# Patient Record
Sex: Male | Born: 1970 | Race: Black or African American | Hispanic: No
Health system: Southern US, Community
[De-identification: ages and names within clinical notes are randomized; demographics above are authoritative.]

## PROBLEM LIST (undated history)

## (undated) DIAGNOSIS — E119 Type 2 diabetes mellitus without complications: Secondary | ICD-10-CM

## (undated) HISTORY — DX: Type 2 diabetes mellitus without complications: E11.9

## (undated) HISTORY — PX: MOUTH SURGERY: SHX715

---

## 2001-05-18 ENCOUNTER — Emergency Department (HOSPITAL_COMMUNITY): Admission: EM | Admit: 2001-05-18 | Discharge: 2001-05-18 | Payer: Self-pay | Admitting: Emergency Medicine

## 2002-10-27 ENCOUNTER — Emergency Department (HOSPITAL_COMMUNITY): Admission: EM | Admit: 2002-10-27 | Discharge: 2002-10-27 | Payer: Self-pay | Admitting: Emergency Medicine

## 2003-08-07 ENCOUNTER — Encounter: Payer: Self-pay | Admitting: Oral Surgery

## 2003-08-07 ENCOUNTER — Observation Stay (HOSPITAL_COMMUNITY): Admission: EM | Admit: 2003-08-07 | Discharge: 2003-08-08 | Payer: Self-pay | Admitting: Emergency Medicine

## 2003-08-07 ENCOUNTER — Encounter: Payer: Self-pay | Admitting: *Deleted

## 2003-08-07 ENCOUNTER — Encounter: Payer: Self-pay | Admitting: Emergency Medicine

## 2003-08-07 HISTORY — PX: MOUTH SURGERY: SHX715

## 2008-07-11 ENCOUNTER — Emergency Department (HOSPITAL_COMMUNITY): Admission: EM | Admit: 2008-07-11 | Discharge: 2008-07-11 | Payer: Self-pay | Admitting: Emergency Medicine

## 2008-08-27 ENCOUNTER — Encounter: Admission: RE | Admit: 2008-08-27 | Discharge: 2008-08-27 | Payer: Self-pay | Admitting: Orthopedic Surgery

## 2009-05-14 ENCOUNTER — Emergency Department (HOSPITAL_COMMUNITY): Admission: EM | Admit: 2009-05-14 | Discharge: 2009-05-14 | Payer: Self-pay | Admitting: Emergency Medicine

## 2009-11-15 IMAGING — CR DG CHEST 2V
2 series · 2 of 2 positions shown · non-contrast
Comparison: None

CLINICAL DATA: Chest pain

CHEST - 2 VIEW

[w chest pa]
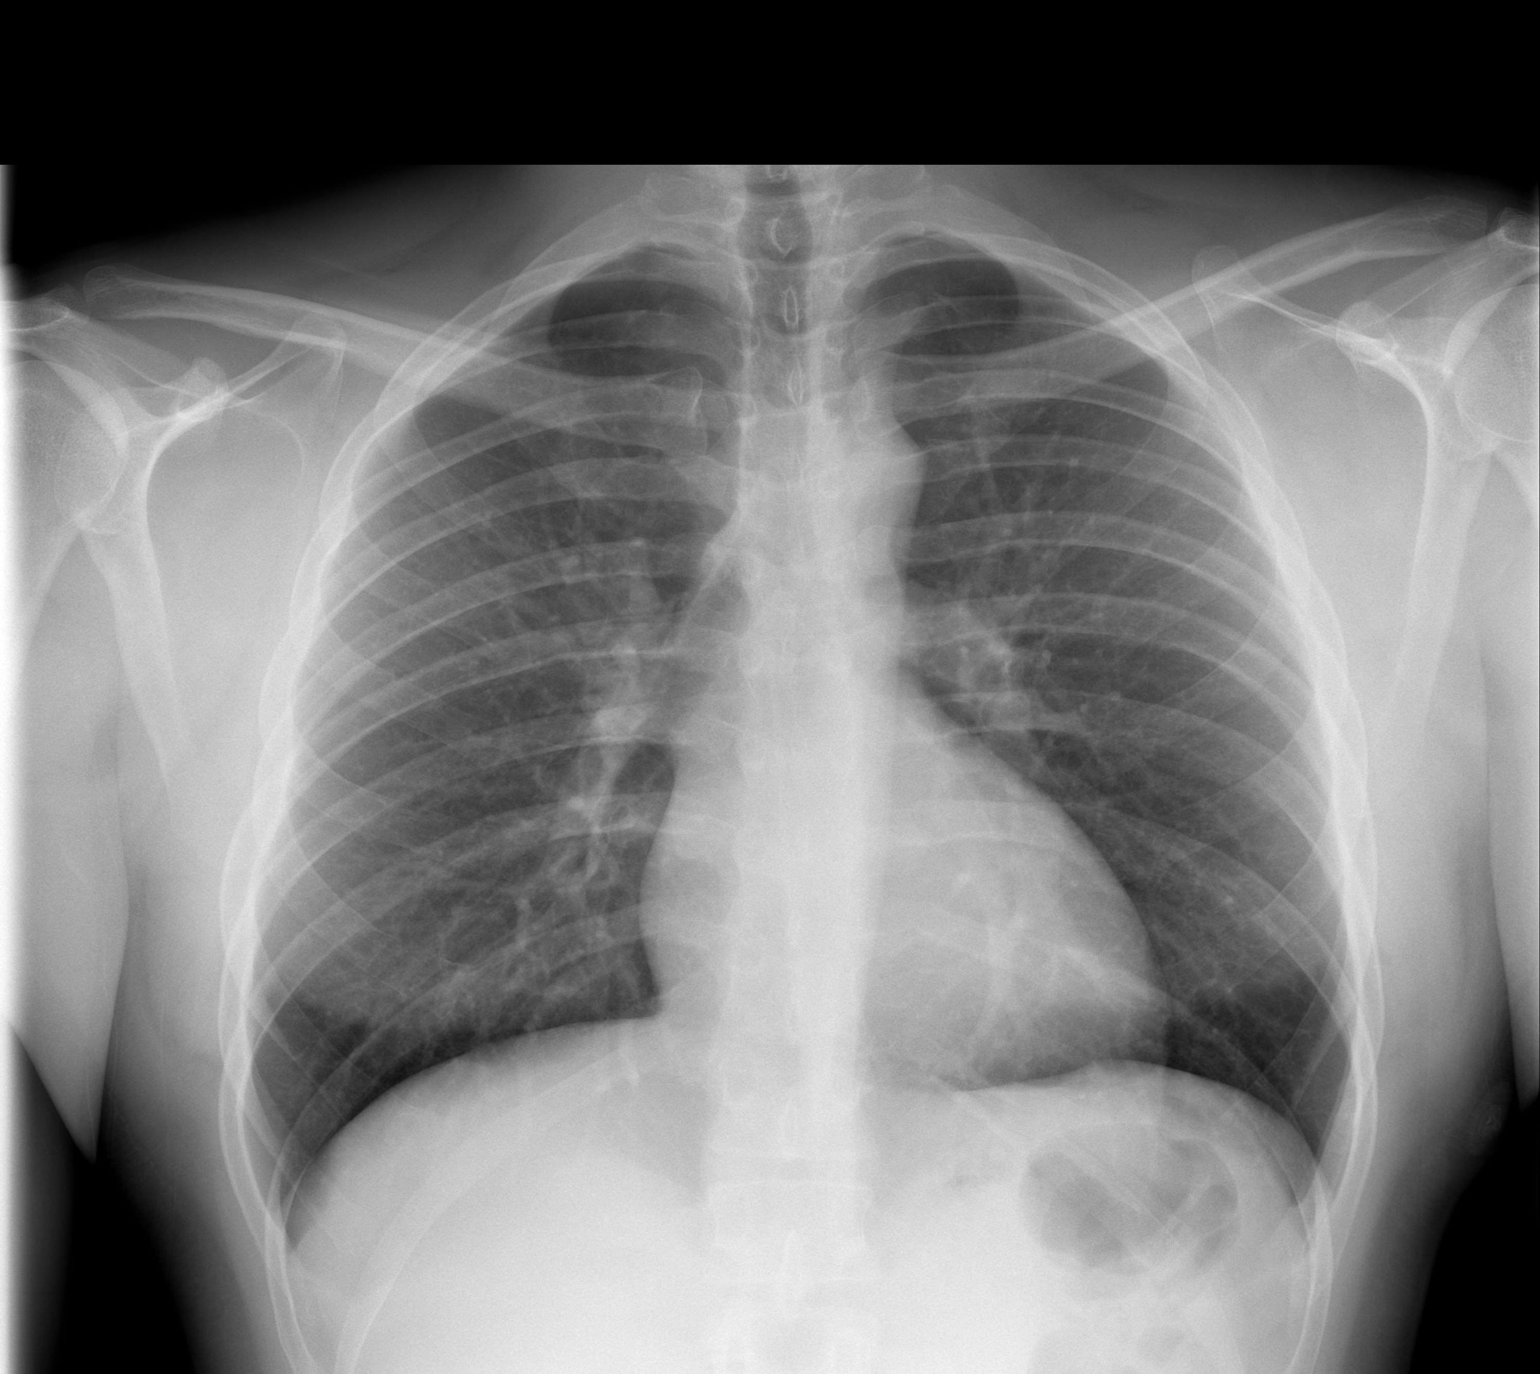

[w chest lat]
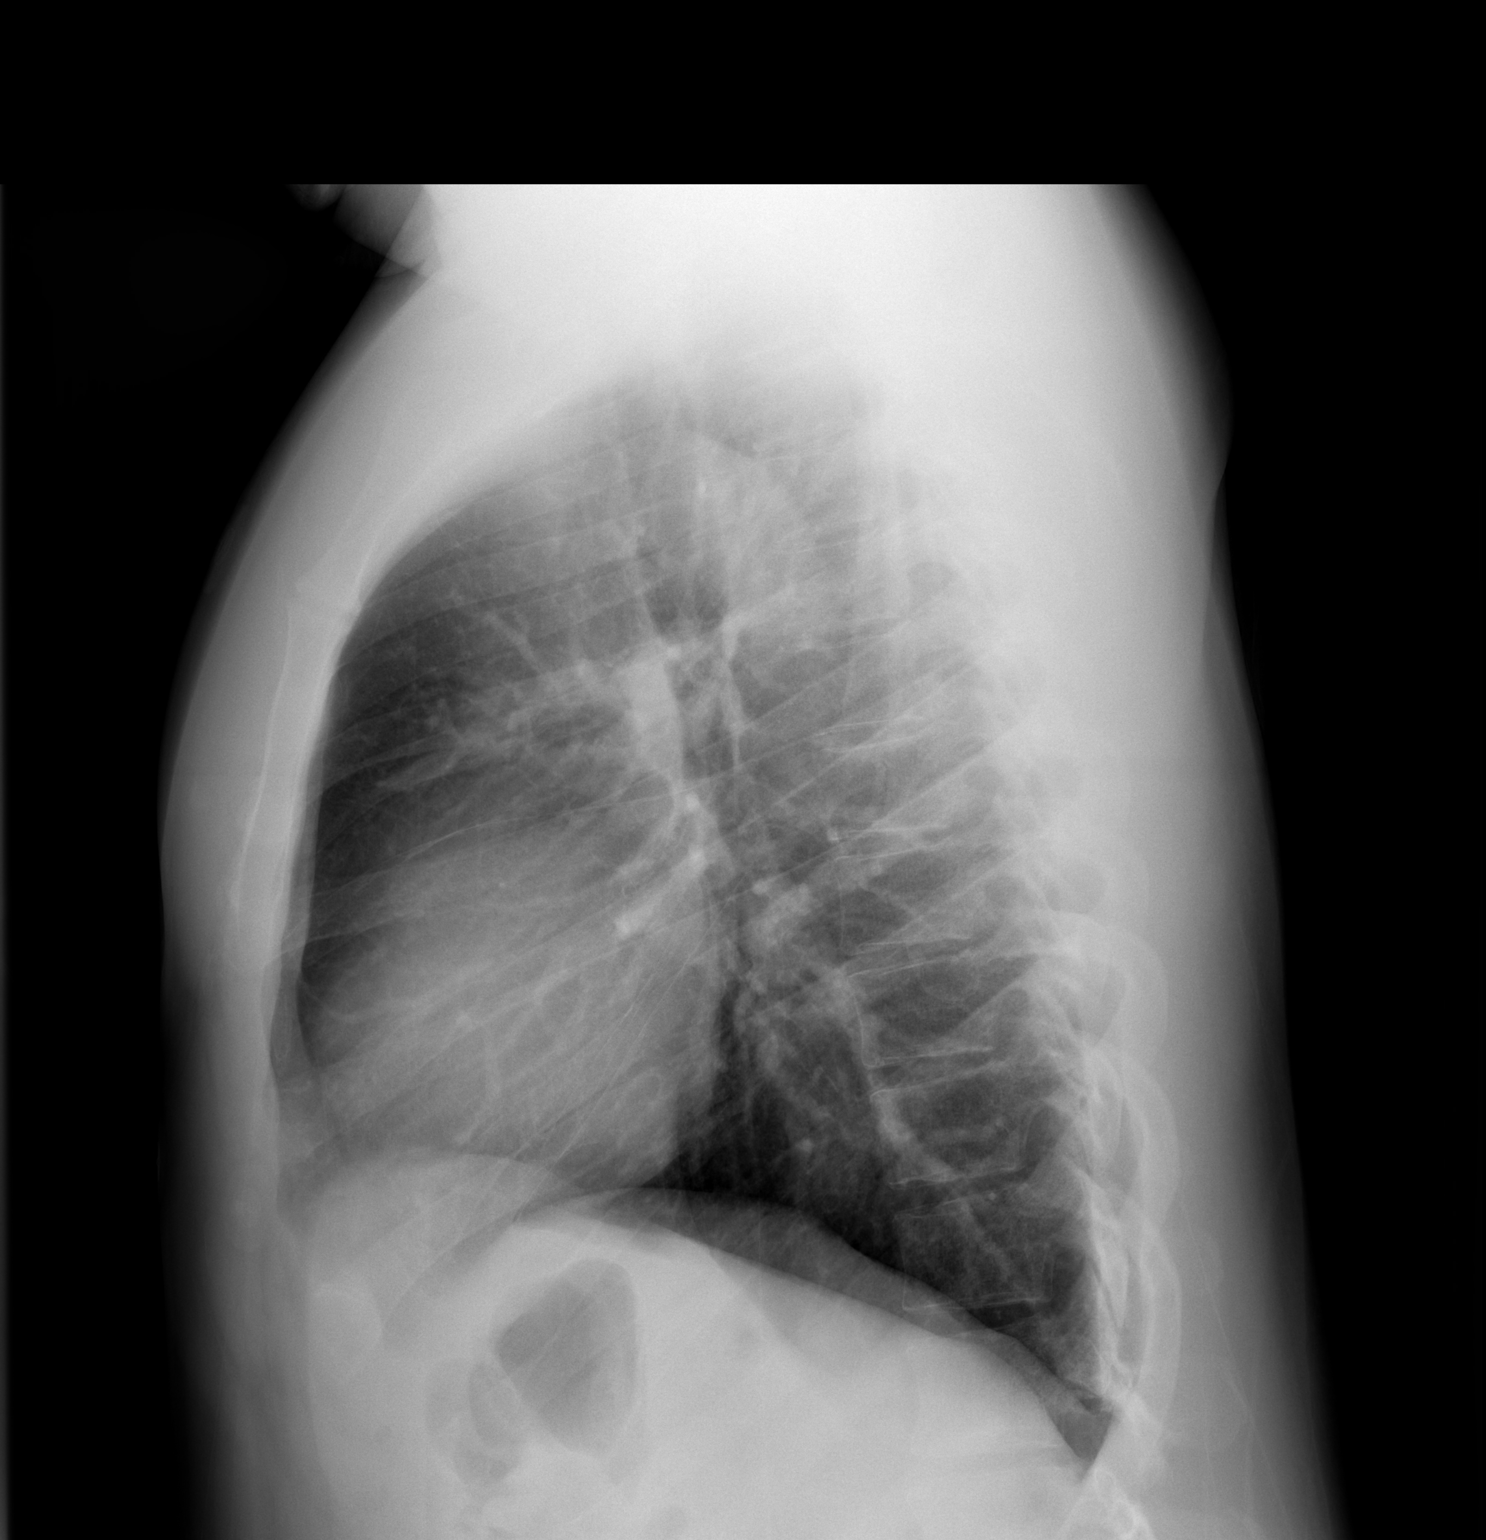

[2 of 2 positions shown; findings below may reference images not displayed]

FINDINGS: The heart size and mediastinal contours are within
normal limits.  Both lungs are clear.  The visualized skeletal
structures are unremarkable.
IMPRESSION: No active cardiopulmonary disease.

## 2010-02-15 ENCOUNTER — Emergency Department (HOSPITAL_COMMUNITY): Admission: EM | Admit: 2010-02-15 | Discharge: 2010-02-16 | Payer: Self-pay | Admitting: Emergency Medicine

## 2010-02-16 ENCOUNTER — Observation Stay (HOSPITAL_COMMUNITY): Admission: EM | Admit: 2010-02-16 | Discharge: 2010-02-17 | Payer: Self-pay | Admitting: Emergency Medicine

## 2010-08-20 IMAGING — US US ABDOMEN COMPLETE
1 series · 14 of 25 positions shown · non-contrast
Comparison: CT 02/15/2010

CLINICAL DATA: Right-sided abdominal pain

COMPLETE ABDOMINAL ULTRASOUND

[Series 1: us abdomen complete · 0.28mm/px · 14 of 57 slices shown]
[im 1/57]
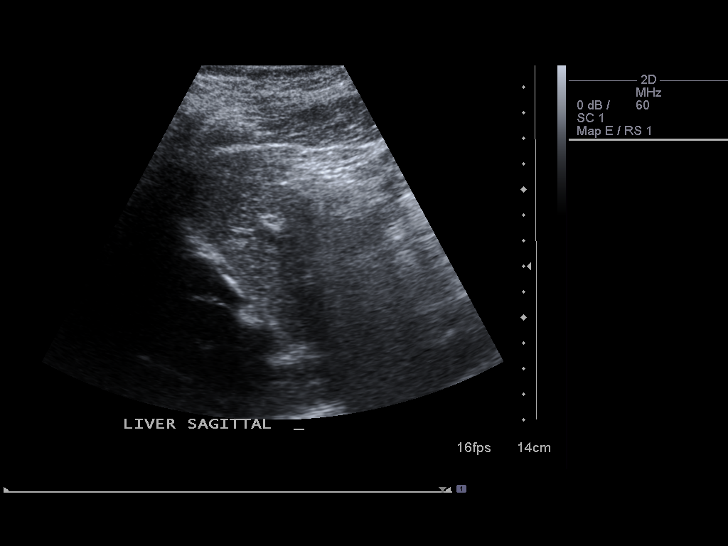
[im 5/57]
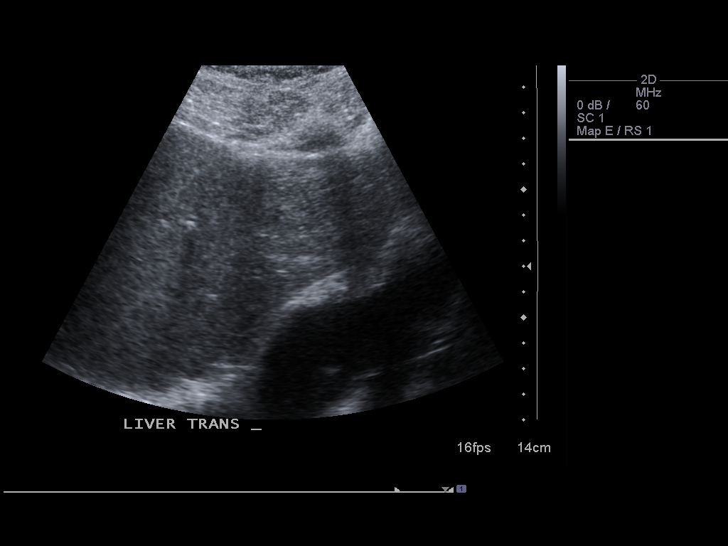
[im 10/57]
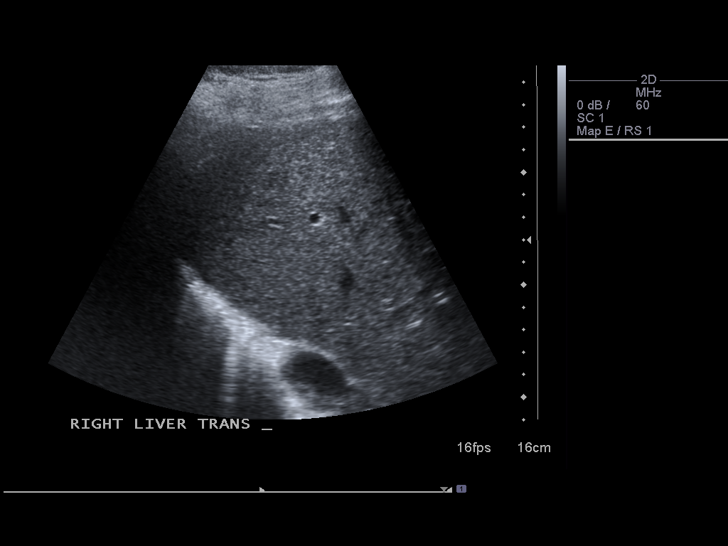
[im 15/57]
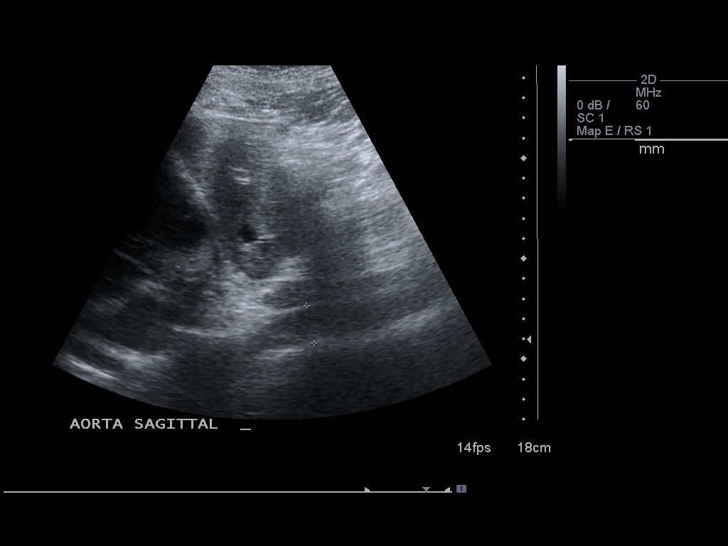
[im 19/57]
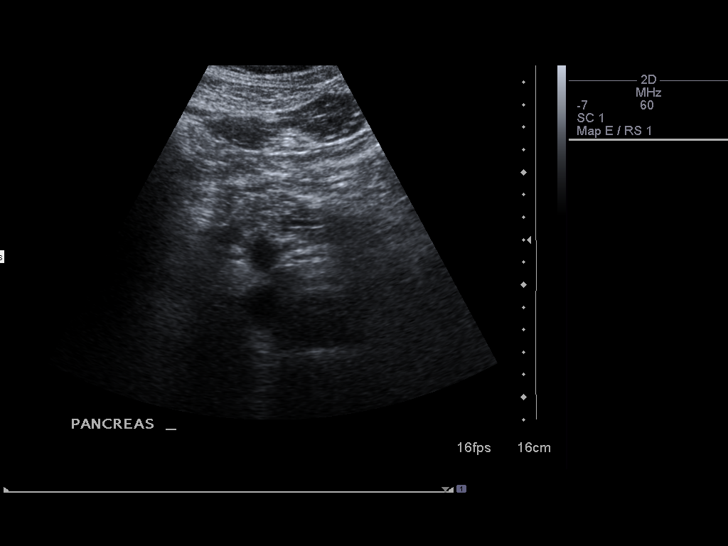
[im 22/57]
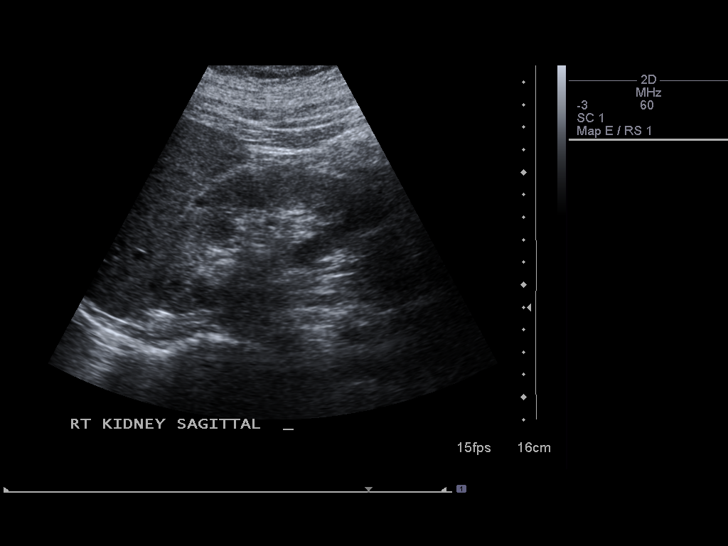
[im 26/57]
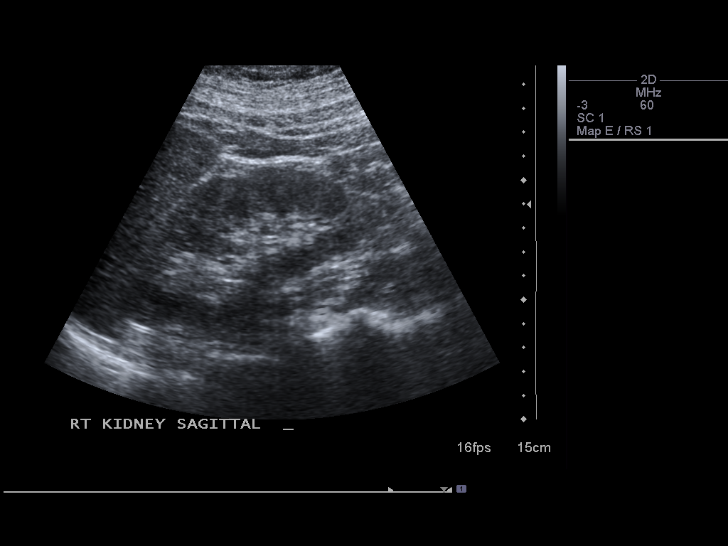
[im 31/57]
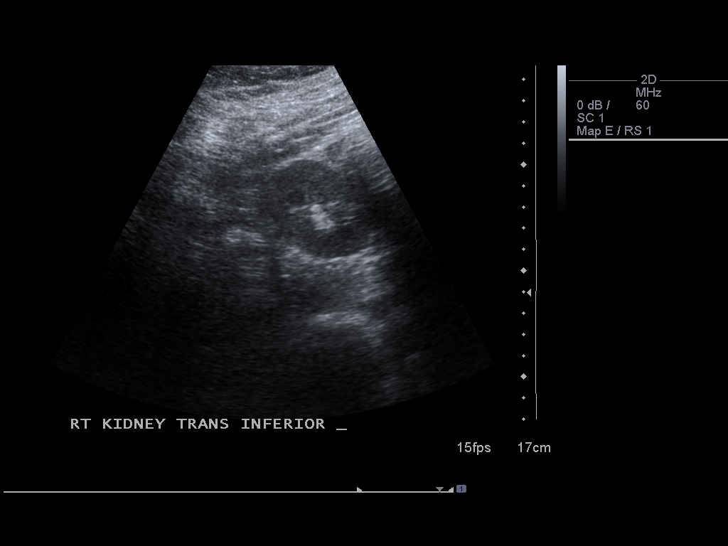
[im 36/57]
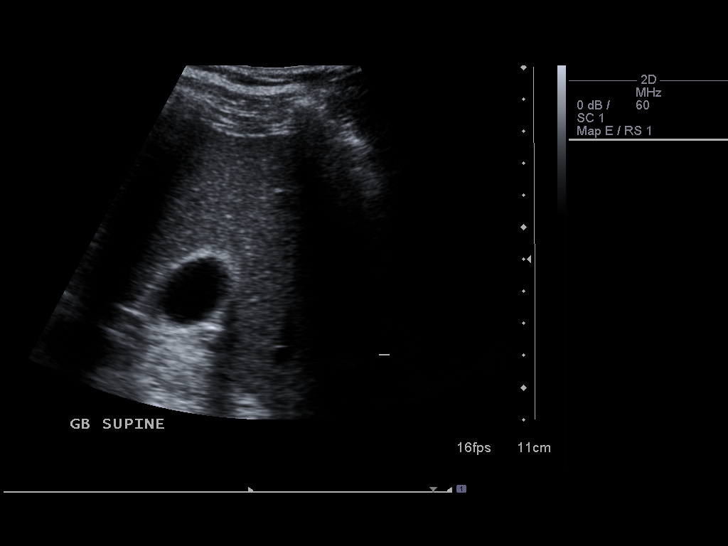
[im 38/57]
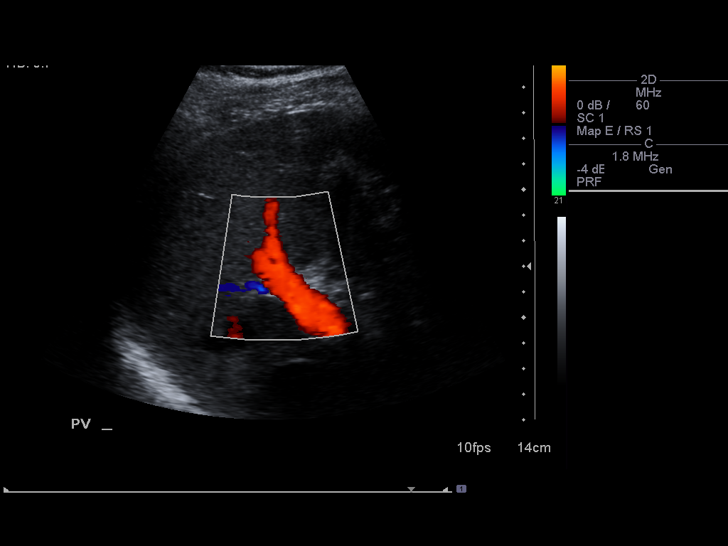
[im 43/57]
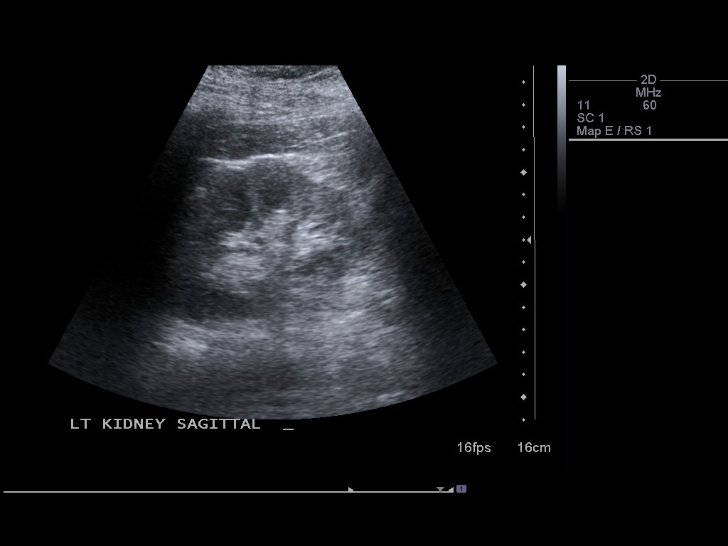
[im 47/57]
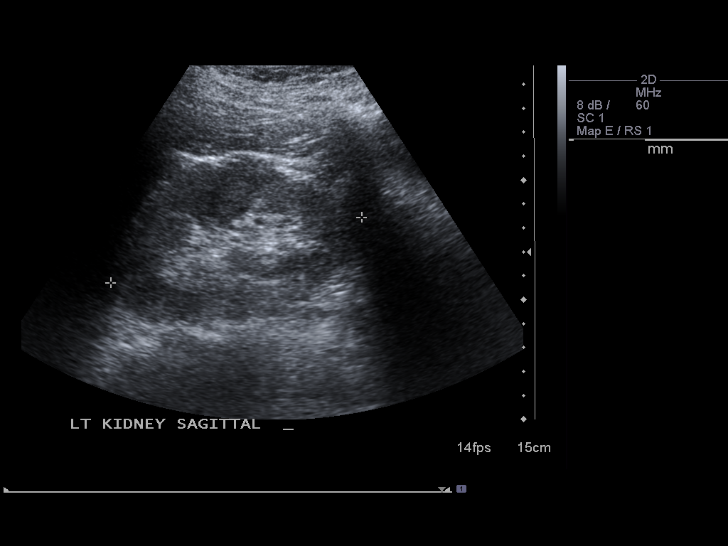
[im 52/57]
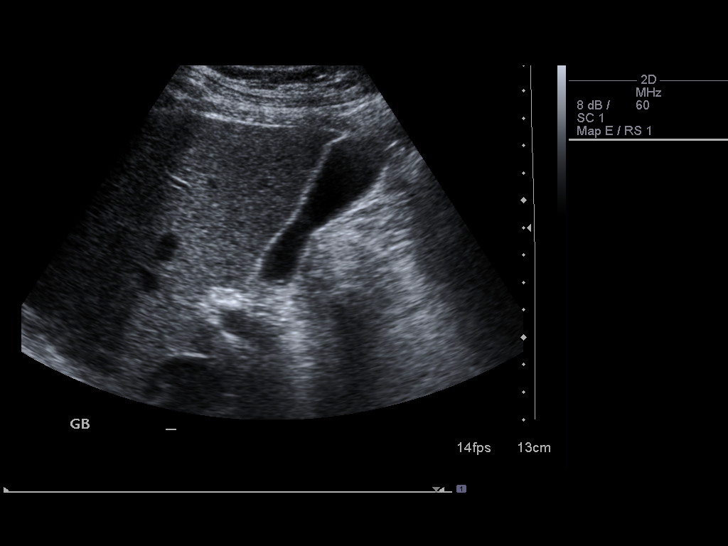
[im 57/57]
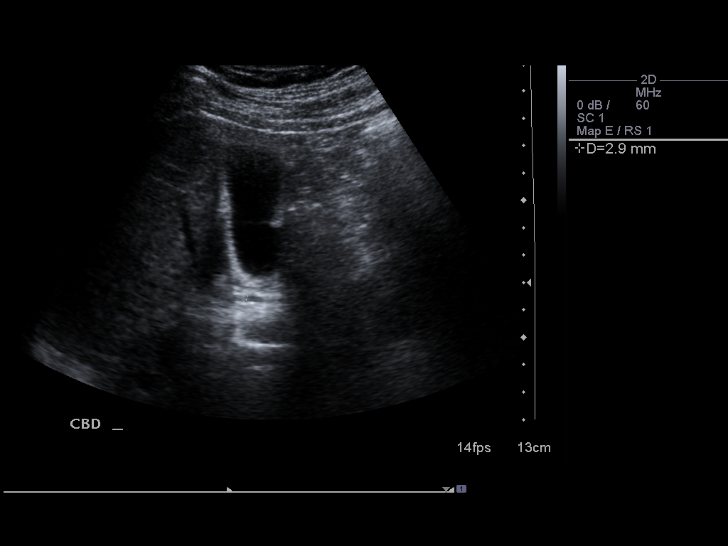

[14 of 25 positions shown; findings below may reference images not displayed]

FINDINGS: Gallbladder:  Normal without stones, sludge or wall thickening.

Common bile duct:  Normal at 3 mm.

Liver:  Normal echogenicity.  No focal lesions.  No biliary duct
dilatation.

IVC:  Normal

Pancreas:  Normal

Spleen:  Normal at 6.0 cm.

Right Kidney:  11.3 cm in length.  Normal echogenicity.  5 mm cyst
in the upper pole.  No mass, stone or hydronephrosis.

Left Kidney:  Normal at 10.8 cm.

Abdominal aorta:  Normal

No ascites
IMPRESSION: Negative abdominal ultrasound.  No cause of pain identified.

## 2011-02-11 LAB — LIPID PANEL
HDL: 36 mg/dL — ABNORMAL LOW (ref >39)
Triglycerides: 148 mg/dL (ref <150)

## 2011-02-11 LAB — DIFFERENTIAL
Basophils Relative: 0 % (ref 0–1)
Eosinophils Absolute: 0.5 10*3/uL (ref 0.0–0.7)
Eosinophils Relative: 4 % (ref 0–5)
Eosinophils Relative: 5 % (ref 0–5)
Lymphocytes Relative: 15 % (ref 12–46)
Lymphocytes Relative: 33 % (ref 12–46)
Lymphs Abs: 3.6 10*3/uL (ref 0.7–4.0)
Monocytes Relative: 9 % (ref 3–12)
Neutro Abs: 12.6 10*3/uL — ABNORMAL HIGH (ref 1.7–7.7)
Neutro Abs: 5.9 10*3/uL (ref 1.7–7.7)
Neutrophils Relative %: 53 % (ref 43–77)
Neutrophils Relative %: 75 % (ref 43–77)

## 2011-02-11 LAB — CBC
HCT: 40.8 % (ref 39.0–52.0)
Hemoglobin: 13.4 g/dL (ref 13.0–17.0)
Hemoglobin: 15.5 g/dL (ref 13.0–17.0)
MCHC: 32.8 g/dL (ref 30.0–36.0)
MCV: 82.7 fL (ref 78.0–100.0)
MCV: 83 fL (ref 78.0–100.0)
RBC: 4.93 MIL/uL (ref 4.22–5.81)
RBC: 5.65 MIL/uL (ref 4.22–5.81)
WBC: 11.1 10*3/uL — ABNORMAL HIGH (ref 4.0–10.5)
WBC: 16.8 10*3/uL — ABNORMAL HIGH (ref 4.0–10.5)

## 2011-02-11 LAB — POCT I-STAT, CHEM 8
Calcium, Ion: 1.04 mmol/L — ABNORMAL LOW (ref 1.12–1.32)
Glucose, Bld: 94 mg/dL (ref 70–99)
Hemoglobin: 17.3 g/dL — ABNORMAL HIGH (ref 13.0–17.0)
Sodium: 136 mEq/L (ref 135–145)

## 2011-02-11 LAB — CULTURE, BLOOD (ROUTINE X 2)
Culture: NO GROWTH
Culture: NO GROWTH

## 2011-02-11 LAB — BASIC METABOLIC PANEL
Chloride: 103 mEq/L (ref 96–112)
Creatinine, Ser: 1.16 mg/dL (ref 0.4–1.5)
GFR calc Af Amer: >60 mL/min (ref >60)
GFR calc non Af Amer: >60 mL/min (ref >60)
Sodium: 136 mEq/L (ref 135–145)

## 2011-02-11 LAB — LIPASE, BLOOD
Lipase: 118 U/L — ABNORMAL HIGH (ref 11–59)
Lipase: 55 U/L (ref 11–59)

## 2011-02-11 LAB — URINALYSIS, ROUTINE W REFLEX MICROSCOPIC
Bilirubin Urine: NEGATIVE
Hgb urine dipstick: NEGATIVE
Ketones, ur: NEGATIVE mg/dL
Nitrite: NEGATIVE
Protein, ur: NEGATIVE mg/dL
Urobilinogen, UA: 0.2 mg/dL (ref 0.0–1.0)

## 2011-02-11 LAB — RAPID URINE DRUG SCREEN, HOSP PERFORMED
Amphetamines: NOT DETECTED
Benzodiazepines: NOT DETECTED
Cocaine: NOT DETECTED
Tetrahydrocannabinol: POSITIVE — AB

## 2011-02-11 LAB — AMYLASE: Amylase: 81 U/L (ref 0–105)

## 2011-02-11 LAB — URINE MICROSCOPIC-ADD ON

## 2011-02-14 LAB — URINALYSIS, ROUTINE W REFLEX MICROSCOPIC
Bilirubin Urine: NEGATIVE
Hgb urine dipstick: NEGATIVE
Ketones, ur: NEGATIVE mg/dL
Protein, ur: NEGATIVE mg/dL
Specific Gravity, Urine: 1.021 (ref 1.005–1.030)
Urobilinogen, UA: 0.2 mg/dL (ref 0.0–1.0)
pH: 8 (ref 5.0–8.0)

## 2011-02-14 LAB — CBC
HCT: 47.5 % (ref 39.0–52.0)
Hemoglobin: 15.9 g/dL (ref 13.0–17.0)
MCV: 82.1 fL (ref 78.0–100.0)
RBC: 5.79 MIL/uL (ref 4.22–5.81)
WBC: 15.6 10*3/uL — ABNORMAL HIGH (ref 4.0–10.5)

## 2011-02-14 LAB — COMPREHENSIVE METABOLIC PANEL
AST: 20 U/L (ref 0–37)
BUN: 6 mg/dL (ref 6–23)
CO2: 30 mEq/L (ref 19–32)
Chloride: 103 mEq/L (ref 96–112)
Creatinine, Ser: 0.99 mg/dL (ref 0.4–1.5)
GFR calc Af Amer: >60 mL/min (ref >60)
GFR calc non Af Amer: >60 mL/min (ref >60)
Total Bilirubin: 0.4 mg/dL (ref 0.3–1.2)

## 2011-02-14 LAB — LIPASE, BLOOD: Lipase: 181 U/L — ABNORMAL HIGH (ref 11–59)

## 2011-02-14 LAB — DIFFERENTIAL
Basophils Absolute: 0.1 10*3/uL (ref 0.0–0.1)
Eosinophils Relative: 2 % (ref 0–5)
Lymphocytes Relative: 18 % (ref 12–46)

## 2011-02-28 LAB — URINE MICROSCOPIC-ADD ON

## 2011-02-28 LAB — CBC
Hemoglobin: 15.5 g/dL (ref 13.0–17.0)
Platelets: 224 10*3/uL (ref 150–400)
RDW: 13.3 % (ref 11.5–15.5)

## 2011-02-28 LAB — HEPATIC FUNCTION PANEL
ALT: 26 U/L (ref 0–53)
AST: 29 U/L (ref 0–37)
Albumin: 3.9 g/dL (ref 3.5–5.2)
Total Protein: 7.5 g/dL (ref 6.0–8.3)

## 2011-02-28 LAB — POCT I-STAT, CHEM 8
Calcium, Ion: 1 mmol/L — ABNORMAL LOW (ref 1.12–1.32)
Chloride: 103 mEq/L (ref 96–112)
Creatinine, Ser: 1.1 mg/dL (ref 0.4–1.5)
Glucose, Bld: 189 mg/dL — ABNORMAL HIGH (ref 70–99)
HCT: 50 % (ref 39.0–52.0)
Hemoglobin: 17 g/dL (ref 13.0–17.0)

## 2011-02-28 LAB — URINALYSIS, ROUTINE W REFLEX MICROSCOPIC
Glucose, UA: NEGATIVE mg/dL
pH: 6 (ref 5.0–8.0)

## 2011-02-28 LAB — DIFFERENTIAL
Basophils Absolute: 0.1 10*3/uL (ref 0.0–0.1)
Lymphocytes Relative: 27 % (ref 12–46)
Neutro Abs: 10.9 10*3/uL — ABNORMAL HIGH (ref 1.7–7.7)
Neutrophils Relative %: 66 % (ref 43–77)
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
## 2012-02-01 ENCOUNTER — Emergency Department (HOSPITAL_BASED_OUTPATIENT_CLINIC_OR_DEPARTMENT_OTHER)
Admission: EM | Admit: 2012-02-01 | Discharge: 2012-02-01 | Disposition: A | Discharge status: Home / Self Care | Payer: Managed Care, Other (non HMO) | Attending: Emergency Medicine | Admitting: Emergency Medicine

## 2012-02-01 ENCOUNTER — Encounter (HOSPITAL_BASED_OUTPATIENT_CLINIC_OR_DEPARTMENT_OTHER): Payer: Self-pay

## 2012-02-01 DIAGNOSIS — K029 Dental caries, unspecified: Secondary | ICD-10-CM | POA: Insufficient documentation

## 2012-02-01 DIAGNOSIS — F17200 Nicotine dependence, unspecified, uncomplicated: Secondary | ICD-10-CM | POA: Insufficient documentation

## 2012-02-01 DIAGNOSIS — K0889 Other specified disorders of teeth and supporting structures: Secondary | ICD-10-CM

## 2012-02-01 MED ORDER — OXYCODONE-ACETAMINOPHEN 5-325 MG PO TABS
1.0000 | ORAL_TABLET | Freq: Once | ORAL | Status: AC
  Administered 2012-02-01: 1 via ORAL
  Filled 2012-02-01: qty 1

## 2012-02-01 MED ORDER — OXYCODONE-ACETAMINOPHEN 5-325 MG PO TABS: 1.0000 | ORAL_TABLET | Freq: Four times a day (QID) | ORAL | Status: AC | PRN

## 2012-02-01 MED ORDER — PENICILLIN V POTASSIUM 250 MG PO TABS
500.0000 mg | ORAL_TABLET | Freq: Once | ORAL | Status: AC
  Administered 2012-02-01: 500 mg via ORAL
  Filled 2012-02-01: qty 2

## 2012-02-01 MED ORDER — PENICILLIN V POTASSIUM 500 MG PO TABS: 500.0000 mg | ORAL_TABLET | Freq: Three times a day (TID) | ORAL | Status: AC

## 2012-02-01 NOTE — ED Provider Notes (Signed)
History     CSN: [id number]  Arrival date & time 02/01/12  1819   First MD Initiated Contact with Patient 02/01/12 1922      Chief Complaint  Patient presents with  . Dental Pain    (Consider location/radiation/quality/duration/timing/severity/associated sxs/prior treatment) HPI Patient presents with complaint of pain in his right upper tooth. He states that he has many dental problems and is trying to arrange for dental surgery. He states the pain began 2 days ago and has been constant. The patient in chewing food make the pain worse. He states he has tried Aleve ibuprofen and Goody powders which have not helped very much with his pain. He denies any fever or difficulty swallowing or difficulty breathing. There no other associated systemic symptoms. There are no other alleviating or modifying factors.  History reviewed. No pertinent past medical history.  Past Surgical History  Procedure Date  . Mouth surgery     No family history on file.  History  Substance Use Topics  . Smoking status: Current Everyday Smoker  . Smokeless tobacco: Never Used  . Alcohol Use: No      Review of Systems ROS reviewed and otherwise negative except for mentioned in HPI  Allergies  Sulfa antibiotics  Home Medications   Current Outpatient Rx  Name Route Sig Dispense Refill  . GOODY HEADACHE PO Oral Take 1 packet by mouth daily as needed. Patient used this medication for headache.    . IBUPROFEN 200 MG PO TABS Oral Take 400 mg by mouth every 6 (six) hours as needed. Patient used this medication for pain.    Marland Kitchen NAPROXEN SODIUM 220 MG PO TABS Oral Take 440 mg by mouth 2 (two) times daily with a meal. Patient used this medication for pain.    . OXYCODONE-ACETAMINOPHEN 5-325 MG PO TABS Oral Take 1-2 tablets by mouth every 6 (six) hours as needed for pain. 15 tablet 0  . PENICILLIN V POTASSIUM 500 MG PO TABS Oral Take 1 tablet (500 mg total) by mouth 3 (three) times daily. 30 tablet 0    BP  125/79  Pulse 79  Temp(Src) 98.7 F (37.1 C) (Oral)  Resp 16  Ht 5\' 8"  (1.727 m)  Wt 180 lb (81.647 kg)  BMI 27.37 kg/m2  SpO2 99% Vitals reviewed Physical Exam Physical Examination: General appearance - alert, well appearing, and in no distress Eyes - pupils equal and reactive, no scleral icterus Mouth - mucous membranes moist, pharynx normal without lesions, multiple missing teeth, dental caries, right upper premolar where pain is located is over 50% eroded, ttp tapping tooth, no periapical abscess or gingival irritation Neck - supple, no significant adenopathy Chest - clear to auscultation, no wheezes, rales or rhonchi, symmetric air entry Heart - normal rate, regular rhythm, normal S1, S2, no murmurs, rubs, clicks or gallops Abdomen - soft, nontender, nondistended, no masses or organomegaly Skin - normal coloration and turgor, no rashes  ED Course  Procedures (including critical care time)  Labs Reviewed - No data to display No results found.   1. Pain, dental       MDM  Patient presenting with dental pain. There is no evidence of abscess at this time and patient is nontoxic in appearance on examination. He will be started on penicillin and given pain medication. I have strongly encouraged him to continue to arrange for outpatient dental treatment of his multiple dental problems. He was given strict return precautions and is agreeable with this plan.  Ethelda Chick, MD 02/01/12 2037

## 2012-02-01 NOTE — ED Notes (Signed)
Dental pain that started yesterday

## 2012-02-01 NOTE — Discharge Instructions (Signed)
Return to the ED with any concerns including difficulty swallowing or breathing, fever, vomiting and not able to keep down antibiotics, or any other alarming symptoms.

## 2012-10-20 ENCOUNTER — Emergency Department (HOSPITAL_COMMUNITY)
Admission: EM | Admit: 2012-10-20 | Discharge: 2012-10-20 | Disposition: A | Discharge status: Home / Self Care | Payer: Managed Care, Other (non HMO) | Source: Home / Self Care | Attending: Emergency Medicine | Admitting: Emergency Medicine

## 2012-10-20 ENCOUNTER — Encounter (HOSPITAL_COMMUNITY): Payer: Self-pay | Admitting: *Deleted

## 2012-10-20 DIAGNOSIS — G43909 Migraine, unspecified, not intractable, without status migrainosus: Secondary | ICD-10-CM

## 2012-10-20 DIAGNOSIS — J069 Acute upper respiratory infection, unspecified: Secondary | ICD-10-CM

## 2012-10-20 DIAGNOSIS — J019 Acute sinusitis, unspecified: Secondary | ICD-10-CM

## 2012-10-20 MED ORDER — DEXAMETHASONE SODIUM PHOSPHATE 10 MG/ML IJ SOLN
10.0000 mg | Freq: Once | INTRAMUSCULAR | Status: AC
  Administered 2012-10-20: 10 mg via INTRAMUSCULAR

## 2012-10-20 MED ORDER — BENZONATATE 200 MG PO CAPS: 200.0000 mg | ORAL_CAPSULE | Freq: Three times a day (TID) | ORAL | Status: DC | PRN

## 2012-10-20 MED ORDER — DIPHENHYDRAMINE HCL 50 MG/ML IJ SOLN
INTRAMUSCULAR | Status: AC
  Filled 2012-10-20: qty 1

## 2012-10-20 MED ORDER — ALBUTEROL SULFATE HFA 108 (90 BASE) MCG/ACT IN AERS: 1.0000 | INHALATION_SPRAY | Freq: Four times a day (QID) | RESPIRATORY_TRACT | Status: DC | PRN

## 2012-10-20 MED ORDER — KETOROLAC TROMETHAMINE 60 MG/2ML IM SOLN
60.0000 mg | Freq: Once | INTRAMUSCULAR | Status: AC
  Administered 2012-10-20: 60 mg via INTRAMUSCULAR

## 2012-10-20 MED ORDER — DEXAMETHASONE SODIUM PHOSPHATE 10 MG/ML IJ SOLN
INTRAMUSCULAR | Status: AC
  Filled 2012-10-20: qty 1

## 2012-10-20 MED ORDER — AMOXICILLIN 500 MG PO CAPS: 1000.0000 mg | ORAL_CAPSULE | Freq: Three times a day (TID) | ORAL | Status: DC

## 2012-10-20 MED ORDER — KETOROLAC TROMETHAMINE 60 MG/2ML IM SOLN
INTRAMUSCULAR | Status: AC
  Filled 2012-10-20: qty 2

## 2012-10-20 MED ORDER — DIPHENHYDRAMINE HCL 50 MG/ML IJ SOLN
25.0000 mg | Freq: Once | INTRAMUSCULAR | Status: AC
  Administered 2012-10-20: 25 mg via INTRAMUSCULAR

## 2012-10-20 MED ORDER — SUMATRIPTAN SUCCINATE 50 MG PO TABS: 50.0000 mg | ORAL_TABLET | ORAL | Status: DC | PRN

## 2012-10-20 NOTE — ED Provider Notes (Addendum)
Chief Complaint  Patient presents with  . Sore Throat    History of Present Illness:   Stephen Wilson is a 41 year old male who presents with a three-day history of sore throat, red, runny eyes, dry cough, tightness in the chest and wheezing, chills, nasal congestion and rhinorrhea with yellow drainage. He is a smoker. He's had no chest pain or fever. With this he's developed a migraine headache. The pain is throbbing and 10 over 10 in intensity. It involves his eyes and ears. He has photophobia and phonophobia but no nausea or vomiting. He has a history of migraine headaches which she usually manages with ibuprofen 800 mg as needed. He denies any visual or neurological symptoms.  Review of Systems:  Other than noted above, the patient denies any of the following symptoms. Systemic:  No fever, chills, sweats, fatigue, myalgias, headache, or anorexia. Eye:  No redness, pain or drainage. ENT:  No earache, ear congestion, nasal congestion, sneezing, rhinorrhea, sinus pressure, sinus pain, post nasal drip, or sore throat. Lungs:  No cough, sputum production, wheezing, shortness of breath, or chest pain. GI:  No abdominal pain, nausea, vomiting, or diarrhea.  PMFSH:  Past medical history, family history, social history, meds, and allergies were reviewed.  Physical Exam:   Vital signs:  BP 128/78  Pulse 84  Temp 98.2 F (36.8 C) (Oral)  Resp 20  SpO2 97% General:  Alert, in no distress. Eye:  No conjunctival injection or drainage. Lids were normal. ENT:  TMs and canals were normal, without erythema or inflammation.  Nasal mucosa was clear and uncongested, without drainage.  Mucous membranes were moist.  Pharynx was clear, without exudate or drainage.  There were no oral ulcerations or lesions. Neck:  Supple, no adenopathy, tenderness or mass. Lungs:  No respiratory distress.  Lungs were clear to auscultation, without wheezes, rales or rhonchi.  Breath sounds were clear and equal bilaterally.  Heart:   Regular rhythm, without gallops, murmers or rubs. Skin:  Clear, warm, and dry, without rash or lesions.  Labs:   Results for orders placed during the hospital encounter of 10/20/12  POCT RAPID STREP A ([facility] ONLY)      Component Value Range   Streptococcus, Group A Screen (Direct) NEGATIVE  NEGATIVE   Assessment:  The primary encounter diagnosis was Viral upper respiratory infection. Diagnoses of Acute sinusitis and Migraine headache were also pertinent to this visit.  Plan:   1.  The following meds were prescribed:   New Prescriptions   ALBUTEROL (PROVENTIL HFA;VENTOLIN HFA) 108 (90 BASE) MCG/ACT INHALER    Inhale 1-2 puffs into the lungs every 6 (six) hours as needed for wheezing.   AMOXICILLIN (AMOXIL) 500 MG CAPSULE    Take 2 capsules (1,000 mg total) by mouth 3 (three) times daily.   BENZONATATE (TESSALON) 200 MG CAPSULE    Take 1 capsule (200 mg total) by mouth 3 (three) times daily as needed for cough.   SUMATRIPTAN (IMITREX) 50 MG TABLET    Take 1 tablet (50 mg total) by mouth every 2 (two) hours as needed for migraine.   2.  The patient was instructed in symptomatic care and handouts were given. 3.  The patient was told to return if becoming worse in any way, if no better in 3 or 4 days, and given some red flag symptoms that would indicate earlier return.   Reuben Likes, MD 10/20/12 2033  Reuben Likes, MD 10/22/12 [phone]

## 2012-10-20 NOTE — ED Notes (Signed)
Pt  Reports   Symptoms  Of  sorethroat   Pain  When swallows  As  Well  As   A   Headache    Symptoms  X      3  Days       Pt  Ambulated  Top room with a   Steady  Fluid  Gait  He  Is  Speaking in  Complete  sentances   And  Is   In  No  Acute  Distress

## 2013-06-22 ENCOUNTER — Emergency Department (INDEPENDENT_AMBULATORY_CARE_PROVIDER_SITE_OTHER)
Admission: EM | Admit: 2013-06-22 | Discharge: 2013-06-22 | Disposition: A | Discharge status: Home / Self Care | Payer: Managed Care, Other (non HMO) | Source: Home / Self Care | Attending: Family Medicine | Admitting: Family Medicine

## 2013-06-22 ENCOUNTER — Encounter (HOSPITAL_COMMUNITY): Payer: Self-pay | Admitting: Emergency Medicine

## 2013-06-22 DIAGNOSIS — S239XXA Sprain of unspecified parts of thorax, initial encounter: Secondary | ICD-10-CM

## 2013-06-22 DIAGNOSIS — S29012A Strain of muscle and tendon of back wall of thorax, initial encounter: Secondary | ICD-10-CM

## 2013-06-22 MED ORDER — TRAMADOL HCL 50 MG PO TABS: 50.0000 mg | ORAL_TABLET | Freq: Four times a day (QID) | ORAL | Status: DC | PRN

## 2013-06-22 MED ORDER — CYCLOBENZAPRINE HCL 5 MG PO TABS: 5.0000 mg | ORAL_TABLET | Freq: Three times a day (TID) | ORAL | Status: DC | PRN

## 2013-06-22 NOTE — ED Provider Notes (Signed)
CSN: [id number]     Arrival date & time 06/22/13  0901 History     First MD Initiated Contact with Patient 06/22/13 [phone]     Chief Complaint  Patient presents with  . Back Pain   (Consider location/radiation/quality/duration/timing/severity/associated sxs/prior Treatment) Patient is a 42 y.o. male presenting with back pain. The history is provided by the patient.  Back Pain Location:  Thoracic spine Quality:  Stabbing Radiates to:  Does not radiate Pain severity:  Moderate Onset quality:  Sudden Context comment:  Onset with cough Worsened by:  Touching and movement Associated symptoms: chest pain   Risk factors comment:  Does heavy lifting at work.   History reviewed. No pertinent past medical history. Past Surgical History  Procedure Laterality Date  . Mouth surgery     History reviewed. No pertinent family history. History  Substance Use Topics  . Smoking status: Current Every Day Smoker  . Smokeless tobacco: Never Used  . Alcohol Use: No    Review of Systems  Constitutional: Negative.   HENT: Negative.   Respiratory: Negative for shortness of breath.   Cardiovascular: Positive for chest pain.  Musculoskeletal: Positive for back pain.    Allergies  Sulfa antibiotics  Home Medications   Current Outpatient Rx  Name  Route  Sig  Dispense  Refill  . albuterol (PROVENTIL HFA;VENTOLIN HFA) 108 (90 BASE) MCG/ACT inhaler   Inhalation   Inhale 1-2 puffs into the lungs every 6 (six) hours as needed for wheezing.   1 Inhaler   0   . amoxicillin (AMOXIL) 500 MG capsule   Oral   Take 2 capsules (1,000 mg total) by mouth 3 (three) times daily.   60 capsule   0     Dispense as written.   . Aspirin-Acetaminophen-Caffeine (GOODY HEADACHE PO)   Oral   Take 1 packet by mouth daily as needed. Patient used this medication for headache.         . benzonatate (TESSALON) 200 MG capsule   Oral   Take 1 capsule (200 mg total) by mouth 3 (three) times daily as needed  for cough.   30 capsule   0   . cyclobenzaprine (FLEXERIL) 5 MG tablet   Oral   Take 1 tablet (5 mg total) by mouth 3 (three) times daily as needed for muscle spasms.   30 tablet   0   . ibuprofen (ADVIL,MOTRIN) 200 MG tablet   Oral   Take 400 mg by mouth every 6 (six) hours as needed. Patient used this medication for pain.         . naproxen sodium (ANAPROX) 220 MG tablet   Oral   Take 440 mg by mouth 2 (two) times daily with a meal. Patient used this medication for pain.         . SUMAtriptan (IMITREX) 50 MG tablet   Oral   Take 1 tablet (50 mg total) by mouth every 2 (two) hours as needed for migraine.   10 tablet   0   . traMADol (ULTRAM) 50 MG tablet   Oral   Take 1 tablet (50 mg total) by mouth every 6 (six) hours as needed for pain.   15 tablet   0    BP 116/70  Pulse 76  Temp(Src) 98.2 F (36.8 C) (Oral)  Resp 16  SpO2 100% Physical Exam  Nursing note and vitals reviewed. Constitutional: He is oriented to person, place, and time. He appears well-developed and well-nourished.  Neck:  Neck supple.  Pulmonary/Chest: He exhibits tenderness.  Musculoskeletal: He exhibits tenderness.       Arms: Lymphadenopathy:    He has no cervical adenopathy.  Neurological: He is alert and oriented to person, place, and time.  Skin: Skin is warm and dry.    ED Course   Procedures (including critical care time)  Labs Reviewed - No data to display No results found. 1. Strain of rhomboid muscle, initial encounter     MDM    Stephen Hoff, MD 06/22/13 [phone]

## 2013-06-22 NOTE — ED Notes (Signed)
Patient states three days ago he was coughing real bad one day with a hiccup and ever since then he has had this pain in his right upper back by his shoulder blade.  OTC pain meds taken but no relief.   Doesn't real know if he injured back any way.  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

## 2013-10-06 ENCOUNTER — Ambulatory Visit: Payer: Managed Care, Other (non HMO) | Admitting: Emergency Medicine

## 2013-10-06 VITALS — BP 100/64 | HR 72 | Temp 98.5°F | Resp 16 | Ht 67.25 in | Wt 169.4 lb

## 2013-10-06 DIAGNOSIS — H579 Unspecified disorder of eye and adnexa: Secondary | ICD-10-CM

## 2013-10-06 DIAGNOSIS — H5710 Ocular pain, unspecified eye: Secondary | ICD-10-CM

## 2013-10-06 DIAGNOSIS — H5711 Ocular pain, right eye: Secondary | ICD-10-CM

## 2013-10-06 MED ORDER — BACITRACIN-POLYMYXIN B OP OINT: 1.0000 "application " | TOPICAL_OINTMENT | Freq: Three times a day (TID) | OPHTHALMIC | Status: DC

## 2013-10-06 NOTE — Patient Instructions (Signed)
If you're not better in the morning call [facility] ophthalmology at [phone]. To them I spoke with Dr. Karma Lew and he said he would get you an appointment on Monday . Use your ointment 3-4 times a day .

## 2013-10-06 NOTE — Progress Notes (Addendum)
Subjective:    Patient ID: Stephen Wilson, male    DOB: Jul 12, 1971, 42 y.o.   MRN: [id number] This chart was scribed for Stephen Chris, MD by Danella Maiers, ED Scribe. This patient was seen in room 3 and the patient's care was started at 1:04 PM.  Chief Complaint  Patient presents with  . Eye Pain    Right, X lastnight    HPI HPI Comments: Stephen Wilson is a 42 y.o. male who presents to the [facility] complaining of right eye pain that started last night while watching TV. He states when he looks down and to the left it feels like something is poking him. It started tearing last night. He rates the severity of his pain as an 8/10. He denies injury. He denies anything getting into his eye at work yesterday. He does not wear contacts. He denies recent illness, photophobia. He is otherwise healthy. He denies family h/o eye problems.   There are no active problems to display for this patient.  History reviewed. No pertinent past medical history. Past Surgical History  Procedure Laterality Date  . Mouth surgery     Allergies  Allergen Reactions  . Sulfa Antibiotics Hives   Prior to Admission medications   Medication Sig Start Date End Date Taking? Authorizing Provider  albuterol (PROVENTIL HFA;VENTOLIN HFA) 108 (90 BASE) MCG/ACT inhaler Inhale 1-2 puffs into the lungs every 6 (six) hours as needed for wheezing. 10/20/12   Reuben Likes, MD  amoxicillin (AMOXIL) 500 MG capsule Take 2 capsules (1,000 mg total) by mouth 3 (three) times daily. 10/20/12   Reuben Likes, MD  Aspirin-Acetaminophen-Caffeine (GOODY HEADACHE PO) Take 1 packet by mouth daily as needed. Patient used this medication for headache.    Historical Provider, MD  benzonatate (TESSALON) 200 MG capsule Take 1 capsule (200 mg total) by mouth 3 (three) times daily as needed for cough. 10/20/12   Reuben Likes, MD  cyclobenzaprine (FLEXERIL) 5 MG tablet Take 1 tablet (5 mg total) by mouth 3 (three)  times daily as needed for muscle spasms. 06/22/13   Linna Hoff, MD  cyclobenzaprine (FLEXERIL) 5 MG tablet Take 1 tablet (5 mg total) by mouth 3 (three) times daily as needed for muscle spasms. 06/22/13   Linna Hoff, MD  ibuprofen (ADVIL,MOTRIN) 200 MG tablet Take 400 mg by mouth every 6 (six) hours as needed. Patient used this medication for pain.    Historical Provider, MD  naproxen sodium (ANAPROX) 220 MG tablet Take 440 mg by mouth 2 (two) times daily with a meal. Patient used this medication for pain.    Historical Provider, MD  SUMAtriptan (IMITREX) 50 MG tablet Take 1 tablet (50 mg total) by mouth every 2 (two) hours as needed for migraine. 10/20/12   Reuben Likes, MD  traMADol (ULTRAM) 50 MG tablet Take 1 tablet (50 mg total) by mouth every 6 (six) hours as needed for pain. 06/22/13   Linna Hoff, MD  traMADol (ULTRAM) 50 MG tablet Take 1 tablet (50 mg total) by mouth every 6 (six) hours as needed for pain. 06/22/13   Linna Hoff, MD   History  Substance Use Topics  . Smoking status: Current Every Day Smoker  . Smokeless tobacco: Never Used  . Alcohol Use: No       Review of Systems  Eyes: Positive for pain. Negative for photophobia.       Objective:   Physical Exam  Pupils equal round regular react to light direct and consensual extraocular muscle movements are intact. Cornea appears clear. Lid was everted and swabbed. Patient received no relief with Pontocaine drops. Forcing staining revealed no uptake over the cornea. There is no injection around the cornea. The medial portion of the upper lid was irrigated no foreign body was seen. Disc margins are sharp. He has significant tearing from his right eye and tends to hold his eyes closed   Filed Vitals:   10/06/13 1259  BP: 100/64  Pulse: 72  Temp: 98.5 F (36.9 C)  TempSrc: Oral  Resp: 16  Height: 5' 7.25" (1.708 m)  Weight: 169 lb 6.4 oz (76.839 kg)  SpO2: 97%        Assessment & Plan:    patient presents  with 8/10 pain with no relief with Pontocaine. no foreign body found on examination call placed to Dr.  Randon Goldsmith on who is on-call for [facility]. I spoke with Dr. Randon Goldsmith and he feels this is most likely a corneal erosion. We'll place the patient on bacitracin ophthalmic ointment to apply 3-4 times a day and he will see him tomorrow if he is not significantly better.    I personally performed the services described in this documentation, which was scribed in my presence. The recorded information has been reviewed and is accurate.

## 2017-12-20 DIAGNOSIS — M6530 Trigger finger, unspecified finger: Secondary | ICD-10-CM | POA: Insufficient documentation

## 2018-01-08 ENCOUNTER — Encounter (HOSPITAL_COMMUNITY): Payer: Self-pay | Admitting: Emergency Medicine

## 2018-01-08 DIAGNOSIS — R05 Cough: Secondary | ICD-10-CM | POA: Insufficient documentation

## 2018-01-08 DIAGNOSIS — Z5321 Procedure and treatment not carried out due to patient leaving prior to being seen by health care provider: Secondary | ICD-10-CM | POA: Diagnosis not present

## 2018-01-08 LAB — LIPASE, BLOOD: LIPASE: 158 U/L — AB (ref 11–51)

## 2018-01-08 LAB — CBC
HCT: 44.3 % (ref 39.0–52.0)
HEMOGLOBIN: 15.4 g/dL (ref 13.0–17.0)
MCH: 27.2 pg (ref 26.0–34.0)
MCHC: 34.8 g/dL (ref 30.0–36.0)
MCV: 78.3 fL (ref 78.0–100.0)
PLATELETS: 189 10*3/uL (ref 150–400)
RBC: 5.66 MIL/uL (ref 4.22–5.81)
RDW: 13.5 % (ref 11.5–15.5)
WBC: 10.3 10*3/uL (ref 4.0–10.5)

## 2018-01-08 LAB — COMPREHENSIVE METABOLIC PANEL
ALK PHOS: 96 U/L (ref 38–126)
ALT: 30 U/L (ref 17–63)
AST: 25 U/L (ref 15–41)
Albumin: 4.3 g/dL (ref 3.5–5.0)
Anion gap: 11 (ref 5–15)
BUN: 9 mg/dL (ref 6–20)
CALCIUM: 9.2 mg/dL (ref 8.9–10.3)
CO2: 23 mmol/L (ref 22–32)
CREATININE: 1.07 mg/dL (ref 0.61–1.24)
Chloride: 99 mmol/L — ABNORMAL LOW (ref 101–111)
Glucose, Bld: 165 mg/dL — ABNORMAL HIGH (ref 65–99)
Potassium: 4 mmol/L (ref 3.5–5.1)
SODIUM: 133 mmol/L — AB (ref 135–145)
Total Bilirubin: 0.8 mg/dL (ref 0.3–1.2)
Total Protein: 8.2 g/dL — ABNORMAL HIGH (ref 6.5–8.1)

## 2018-01-08 NOTE — ED Triage Notes (Signed)
Patient c/o cough, congestion, abdominal pain and emesis since yesterday. Denies diarrhea.

## 2018-01-09 ENCOUNTER — Emergency Department (HOSPITAL_COMMUNITY)
Admission: EM | Admit: 2018-01-09 | Discharge: 2018-01-09 | Disposition: A | Discharge status: Home / Self Care | Payer: 59 | Attending: Emergency Medicine | Admitting: Emergency Medicine

## 2018-01-09 NOTE — ED Notes (Signed)
Pt called from triage with no answer 

## 2020-01-22 ENCOUNTER — Ambulatory Visit
Admission: EM | Admit: 2020-01-22 | Discharge: 2020-01-22 | Disposition: A | Discharge status: Home / Self Care | Payer: 59 | Attending: Emergency Medicine | Admitting: Emergency Medicine

## 2020-01-22 DIAGNOSIS — K0889 Other specified disorders of teeth and supporting structures: Secondary | ICD-10-CM | POA: Diagnosis not present

## 2020-01-22 DIAGNOSIS — F17200 Nicotine dependence, unspecified, uncomplicated: Secondary | ICD-10-CM

## 2020-01-22 DIAGNOSIS — K029 Dental caries, unspecified: Secondary | ICD-10-CM

## 2020-01-22 MED ORDER — MELOXICAM 7.5 MG PO TABS: 7.5000 mg | ORAL_TABLET | Freq: Every day | ORAL | 0 refills | Status: DC

## 2020-01-22 MED ORDER — AMOXICILLIN 500 MG PO CAPS: 500.0000 mg | ORAL_CAPSULE | Freq: Two times a day (BID) | ORAL | 0 refills | Status: AC

## 2020-01-22 NOTE — Discharge Instructions (Addendum)
Take antibiotic with food. Keep dentist appointment for further workup. Return for worsening pain, fever, chest pain, difficulty swallowing/breathing.

## 2020-01-22 NOTE — ED Provider Notes (Signed)
[name]    CSN: [id number] Arrival date & time: 01/22/20  1650      History   Chief Complaint Chief Complaint  Patient presents with  . Dental Pain    HPI Stephen Wilson is a 49 y.o. male without significant medical history presenting for 3-day course of left upper and lower dental pain.  Patient has significant dental decay and has appointment scheduled in 2 weeks with his dentist for further work including having metal plates put in.  Patient denies fever, jaw pain, ear pain, nose pain.  No difficulty breathing or swallowing, chest pain.  Tried taking single dose of ibuprofen earlier today without significant relief.   History reviewed. No pertinent past medical history.  There are no problems to display for this patient.   Past Surgical History:  Procedure Laterality Date  . MOUTH SURGERY         Home Medications    Prior to Admission medications   Medication Sig Start Date End Date Taking? Authorizing Provider  amoxicillin (AMOXIL) 500 MG capsule Take 1 capsule (500 mg total) by mouth 2 (two) times daily for 7 days. 01/22/20 01/29/20  Hall-Potvin, [place], PA-C  meloxicam (MOBIC) 7.5 MG tablet Take 1 tablet (7.5 mg total) by mouth daily. 01/22/20   Hall-Potvin, [place], PA-C  naproxen sodium (ANAPROX) 220 MG tablet Take 440 mg by mouth 2 (two) times daily with a meal. Patient used this medication for pain.    [provider]    Family History Family History  Problem Relation Age of Onset  . Diabetes Maternal Grandmother   . Cancer Maternal Grandmother     Social History Social History   Tobacco Use  . Smoking status: Current Every Day Smoker  . Smokeless tobacco: Never Used  Substance Use Topics  . Alcohol use: No  . Drug use: Not on file     Allergies   Sulfa antibiotics   Review of Systems As per HPI   Physical Exam Triage Vital Signs ED Triage Vitals  Enc Vitals Group     BP      Pulse      Resp      Temp    Temp src      SpO2      Weight      Height      Head Circumference      Peak Flow      Pain Score      Pain Loc      Pain Edu?      Excl. in GC?    No data found.  Updated Vital Signs BP (!) 156/91 (BP Location: Left Arm)   Pulse 66   Temp 98.3 F (36.8 C) (Oral)   Resp 16   SpO2 96%   Visual Acuity Right Eye Distance:   Left Eye Distance:   Bilateral Distance:    Right Eye Near:   Left Eye Near:    Bilateral Near:     Physical Exam Constitutional:      General: He is not in acute distress. HENT:     Head: Normocephalic and atraumatic.     Right Ear: Tympanic membrane, ear canal and external ear normal.     Left Ear: Tympanic membrane, ear canal and external ear normal.     Nose: Nose normal.     Mouth/Throat:     Pharynx: Oropharynx is clear.     Comments: Poor dentition with significant dental decay and numerous  teeth missing.  Patient does have TTP of left upper and lower gumline without fluctuance, purulence, erythema or edema. Eyes:     General: No scleral icterus.    Pupils: Pupils are equal, round, and reactive to light.  Cardiovascular:     Rate and Rhythm: Normal rate and regular rhythm.  Pulmonary:     Effort: Pulmonary effort is normal. No respiratory distress.     Breath sounds: No wheezing.  Musculoskeletal:     Cervical back: Neck supple. No tenderness.  Lymphadenopathy:     Cervical: No cervical adenopathy.  Skin:    Coloration: Skin is not jaundiced or pale.  Neurological:     Mental Status: He is alert and oriented to person, place, and time.      UC Treatments / Results  Labs (all labs ordered are listed, but only abnormal results are displayed) Labs Reviewed - No data to display  EKG   Radiology No results found.  Procedures Procedures (including critical care time)  Medications Ordered in UC Medications - No data to display  Initial Impression / Assessment and Plan / UC Course  I have reviewed the triage vital signs  and the nursing notes.  Pertinent labs & imaging results that were available during my care of the patient were reviewed by me and considered in my medical decision making (see chart for details).     Patient afebrile, nontoxic in office today.  Patient does have significant dental decay concerning for secondary infection causing pain.  Will start amoxicillin today, Mobic for pain.  Patient to keep dental appointment in 2 weeks.  Return precautions discussed, patient verbalized understanding and is agreeable to plan. Final Clinical Impressions(s) / UC Diagnoses   Final diagnoses:  Pain, dental  Dental decay     Discharge Instructions     Take antibiotic with food. Keep dentist appointment for further workup. Return for worsening pain, fever, chest pain, difficulty swallowing/breathing.    ED Prescriptions    Medication Sig Dispense Auth. Provider   amoxicillin (AMOXIL) 500 MG capsule Take 1 capsule (500 mg total) by mouth 2 (two) times daily for 7 days. 14 capsule Hall-Potvin, [place], PA-C   meloxicam (MOBIC) 7.5 MG tablet Take 1 tablet (7.5 mg total) by mouth daily. 14 tablet Hall-Potvin, [place], PA-C     I have reviewed the PDMP during this encounter.   Hall-Potvin, [place], [place] 01/22/20 1751

## 2020-01-22 NOTE — ED Triage Notes (Signed)
Pt c/o tooth ache to lt upper and lower for 3 days. States has a Education officer, community appt in 2wks.

## 2020-02-17 ENCOUNTER — Ambulatory Visit: Payer: Self-pay | Attending: Internal Medicine

## 2020-02-17 DIAGNOSIS — Z23 Encounter for immunization: Secondary | ICD-10-CM

## 2020-02-17 NOTE — Progress Notes (Signed)
   Covid-19 Vaccination Clinic  Name:  Stephen Wilson    MRN: [id number] DOB: 1971-06-13  02/17/2020  [name] was observed post Covid-19 immunization for 15 minutes without incident. He was provided with Vaccine Information Sheet and instruction to access the V-Safe system.   [name] was instructed to call 911 with any severe reactions post vaccine: Marland Kitchen Difficulty breathing  . Swelling of face and throat  . A fast heartbeat  . A bad rash all over body  . Dizziness and weakness   Immunizations Administered    Name Date Dose VIS Date Route   Pfizer COVID-19 Vaccine 02/17/2020 11:58 AM 0.3 mL 11/01/2019 Intramuscular   Manufacturer: [employer], [employer]   Lot: [id number]   NDC: [address]-1

## 2020-03-07 ENCOUNTER — Ambulatory Visit: Payer: Self-pay | Attending: Internal Medicine

## 2020-03-07 DIAGNOSIS — Z23 Encounter for immunization: Secondary | ICD-10-CM

## 2020-03-07 NOTE — Progress Notes (Signed)
   Covid-19 Vaccination Clinic  Name:  Stephen Wilson    MRN: [id number] DOB: 09-Aug-1971  03/07/2020  Mr. Stephen Wilson was observed post Covid-19 immunization for 15 minutes without incident. He was provided with Vaccine Information Sheet and instruction to access the V-Safe system.   Mr. Stephen Wilson was instructed to call 911 with any severe reactions post vaccine: Marland Kitchen Difficulty breathing  . Swelling of face and throat  . A fast heartbeat  . A bad rash all over body  . Dizziness and weakness   Immunizations Administered    Name Date Dose VIS Date Route   Pfizer COVID-19 Vaccine 03/07/2020  2:53 PM 0.3 mL 11/01/2019 Intramuscular   Manufacturer: [employer], [employer]   Lot: [id number]   NDC: [address]-4

## 2020-03-10 ENCOUNTER — Ambulatory Visit: Payer: Self-pay

## 2021-06-23 ENCOUNTER — Other Ambulatory Visit: Payer: Self-pay

## 2021-06-23 ENCOUNTER — Ambulatory Visit (INDEPENDENT_AMBULATORY_CARE_PROVIDER_SITE_OTHER): Payer: Self-pay | Admitting: Student

## 2021-06-23 ENCOUNTER — Encounter: Payer: Self-pay | Admitting: Student

## 2021-06-23 DIAGNOSIS — E1142 Type 2 diabetes mellitus with diabetic polyneuropathy: Secondary | ICD-10-CM

## 2021-06-23 DIAGNOSIS — Z0000 Encounter for general adult medical examination without abnormal findings: Secondary | ICD-10-CM | POA: Insufficient documentation

## 2021-06-23 DIAGNOSIS — E119 Type 2 diabetes mellitus without complications: Secondary | ICD-10-CM | POA: Insufficient documentation

## 2021-06-23 MED ORDER — METFORMIN HCL 500 MG PO TABS: 500.0000 mg | ORAL_TABLET | Freq: Two times a day (BID) | ORAL | 3 refills | Status: DC

## 2021-06-23 NOTE — Assessment & Plan Note (Signed)
Patient is due for colonoscopy but he currently has no shortness.  He is in the process of trying to attain assistance. Will also need HIV and hepatitis C screening. He is vaccinated for COVID 02/17/2020, 03/07/2020, boster 09/11/2020 pfizer

## 2021-06-23 NOTE — Patient Instructions (Addendum)
Mr. Kallam it was a pleasure meeting you today.   Regarding your diabetes, we will start a medication called metformin at 500mg  TWO times a day.   For your neuropathy, it is likely related to your diabetes and we will increase the gabapentin that you were previously prescribed to 300mg , THREE times a day.   Please call our office if you have any other questions.

## 2021-06-23 NOTE — Progress Notes (Signed)
   CC:  establish care, recently diagnosed diabetes  HPI:  [name] is a 50 y.o. with a PMH of recently diagnosed T2DM who is presenting to establish care with our clinic.    Patient reports that about a month prior to presenting to [facility] 7/26, he was using his hands to help go from the seated to standing position and he started experiencing some numbness and tingling in his hands. He states that this eventually subsided without any intervention.   He eventually developing numbness, tingling, and burning sensation in his feet and legs. Because of the persistent neuropathic pain he decided to present to the ED. He denies fever, SOB, recent diarrheal illness, polyuria, polydipsia, weakness in his lower extremities or upper extremities, color changes in his extremities, neck or back pain, history of vitamin b12 deficiency, alcohol use.   Patient does report having a weight loss of ~20lbs over the last 2 years but he attributes this to having a decreased appetite when his mother passed away. He now feels that his weight has stabilized and he is eating regularly.   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 Patient is interested in quitting smoking and uses patches currently. He feels he does not need medications at this time.  He denies alcohol use He notes marijuana use  Allergies: Sulfa antibiotics  Review of Systems:   Negative except per above.   Physical Exam:  Vitals:   06/23/21 0914  BP: 107/89  Pulse: 80  Temp: 97.9 F (36.6 C)  SpO2: 100%  Weight: 150 lb (68 kg)  Height: $Remove'5\' 8"'IftDaaM$  (1.727 m)   Gen: No acute distress CV: regular rate and rhythm, no murmurs, rubs, or gallops, palpable DP, PT, radial Pulm: Non labored breathing on  RA, no rales or wheezing  Abd: Soft, NT, ND Ext: No edema of BLE Neuro: Decreased sensation to soft touch of bilateral feet diffusely up to 1/4 up the shin, decreased sensation to soft touch o the dorsum of the left hand from the 2nd mcp to the base of the thumb to the carpal bones. Sensation in tact on R hand.   Assessment & Plan:   See Encounters Tab for problem based charting.  Patient discussed with Dr. Heber Foster

## 2021-06-23 NOTE — Assessment & Plan Note (Addendum)
Patient was recently diagnosed with type 2 diabetes after presenting to the ED with a complaint of polyneuropathy.  His A1c was 6.8 on labs 7/26.  His vitamin B12 level was 350.  On his CMP he was also noted to have a normal total protein and normal gamma gap.  Patient reports he has never been on any medication for diabetes though per EMR he told the ED that he was diagnosed with diabetes sometime in the past.  He currently denies polyuria, polydipsia.  He has made lifestyle modifications since his diagnosis of diabetes.  He has cut out sugary foods and drinks.  He also notes that he continues to workout. -We will start metformin 500 mg twice daily, patient was instructed about common side effects.  He will follow-up with our clinic in 2months. -Regarding his polyneuropathy, likely secondary to his diabetes.  He was prescribed gabapentin at the ED.  Patient is currently taking 200 mg twice daily.  We increase his dose to 300 mg 3 times daily and he was instructed to let us know this was sufficient.  Patient will need foot exam, eye exam, microalbumin to creatinine ratio during next visit.  Patient is self-pay and is in the process of trying to obtain an orange card.

## 2021-06-24 ENCOUNTER — Other Ambulatory Visit: Payer: Self-pay | Admitting: Student

## 2021-06-24 MED ORDER — METFORMIN HCL 500 MG PO TABS: 500.0000 mg | ORAL_TABLET | Freq: Two times a day (BID) | ORAL | 3 refills | Status: DC

## 2021-06-24 NOTE — Telephone Encounter (Signed)
TC to [facility] on [address], spoke with pharmacist, Zollie Scale.  She states they never received the RX for the Metformin.  Informed pharmacist per chart/Surescripts review, there is a confirmation receipt that RX was received by pharmacy.  Pharmacist rechecked and states they do not have it.  Will forward to Dr. Montez Morita to resend via surescripts. Thank you, SChaplin, RN,BSN

## 2021-06-24 NOTE — Telephone Encounter (Signed)
Pt seen yesterday 06/23/2021 and was unable to pick up the following medication this morning:   metFORMIN metFORMIN (GLUCOPHAGE) 500 MG tablet  Take 1 tablet (500 mg total) by mouth 2 (two) times daily with a meal., Starting Wed 06/23/2021, Until Tue 09/21/2021, Normal  Dispense: 180 tablet  Refills: 3 ordered  Pharmacy: [id number] - [place], [place] - [address] (Ph: [phone])  Order Details Ordered on: 06/23/21  End date: 09/21/21  Authorizing provider: Marolyn Haller, MD

## 2021-06-25 NOTE — Progress Notes (Signed)
Internal Medicine Clinic Attending  Case discussed with Dr. Carter  At the time of the visit.  We reviewed the resident's history and exam and pertinent patient test results.  I agree with the assessment, diagnosis, and plan of care documented in the resident's note.  

## 2021-09-15 ENCOUNTER — Encounter: Payer: Self-pay | Admitting: Internal Medicine

## 2021-09-16 ENCOUNTER — Other Ambulatory Visit (HOSPITAL_COMMUNITY): Payer: Self-pay

## 2021-09-16 ENCOUNTER — Encounter: Payer: Self-pay | Admitting: Internal Medicine

## 2021-09-16 ENCOUNTER — Ambulatory Visit: Payer: Self-pay | Admitting: Internal Medicine

## 2021-09-16 ENCOUNTER — Other Ambulatory Visit: Payer: Self-pay

## 2021-09-16 VITALS — BP 105/80 | HR 80 | Temp 98.3°F | Resp 20 | Ht 68.0 in | Wt 147.4 lb

## 2021-09-16 DIAGNOSIS — E1142 Type 2 diabetes mellitus with diabetic polyneuropathy: Secondary | ICD-10-CM

## 2021-09-16 LAB — POCT GLYCOSYLATED HEMOGLOBIN (HGB A1C): Hemoglobin A1C: 5.7 % — AB (ref 4.0–5.6)

## 2021-09-16 LAB — GLUCOSE, CAPILLARY: Glucose-Capillary: 119 mg/dL — ABNORMAL HIGH (ref 70–99)

## 2021-09-16 MED ORDER — DULOXETINE HCL 30 MG PO CPEP
30.0000 mg | ORAL_CAPSULE | Freq: Every day | ORAL | 3 refills | Status: DC
  Filled 2021-09-16: qty 30, 30d supply, fill #0

## 2021-09-16 NOTE — Assessment & Plan Note (Addendum)
Patient recently diagnosed with type 2 diabetes 3 months ago with an A1c of 6.8%.  Patient was started on metformin 500 mg twice daily.  Patient also complained of associated neuropathy.  Patient states that his lower extremities and upper extremities have numbness/tingling/burning sensation.  Patient was started on gabapentin 300 mg 3 times daily with minimal relief.  Today A1c was improved at 5.7%.  However patient is still complaining of the neuropathy of the upper and lower extremities.  Patient has a fairly improved A1c and is compliant with the medications and has not been a diabetic for very long.  The neuropathy present in this patient unusual.  Additional labs necessary to rule out other causes of neuropathy.  Patient is currently uninsured, labs will be placed for future office visit.  Patient states he will obtain insurance by September 21, 2021; appointment will be set at that time.   PLAN: Continue metformin 500 mg twice daily Started on duloxetine 30 mg daily Follow-up in 2 weeks for labs; BMP/HIV/RPR/TSH/hep C/SPEP/serum light chain/urine microalbumin

## 2021-09-16 NOTE — Patient Instructions (Addendum)
Sent Duloxetine to pharmacy here for your neuropathy    Come back in 2 weeks for labs, when you get your insurance

## 2021-09-17 NOTE — Progress Notes (Signed)
Internal Medicine Clinic Attending  I saw and evaluated the patient.  I personally confirmed the key portions of the history and exam documented by Dr. Ariwodo and I reviewed pertinent patient test results.  The assessment, diagnosis, and plan were formulated together and I agree with the documentation in the resident's note.   

## 2021-10-04 ENCOUNTER — Other Ambulatory Visit: Payer: Self-pay

## 2021-12-17 ENCOUNTER — Other Ambulatory Visit (HOSPITAL_COMMUNITY): Payer: Self-pay

## 2022-01-10 ENCOUNTER — Encounter: Payer: Self-pay | Admitting: Student

## 2022-09-22 ENCOUNTER — Encounter: Payer: Self-pay | Admitting: Student

## 2022-09-22 ENCOUNTER — Ambulatory Visit (INDEPENDENT_AMBULATORY_CARE_PROVIDER_SITE_OTHER): Payer: Self-pay | Admitting: Student

## 2022-09-22 ENCOUNTER — Other Ambulatory Visit (HOSPITAL_COMMUNITY): Payer: Self-pay

## 2022-09-22 VITALS — BP 109/79 | HR 75 | Temp 98.4°F | Ht 68.0 in | Wt 168.0 lb

## 2022-09-22 DIAGNOSIS — E1142 Type 2 diabetes mellitus with diabetic polyneuropathy: Secondary | ICD-10-CM

## 2022-09-22 DIAGNOSIS — E785 Hyperlipidemia, unspecified: Secondary | ICD-10-CM | POA: Insufficient documentation

## 2022-09-22 DIAGNOSIS — Z7984 Long term (current) use of oral hypoglycemic drugs: Secondary | ICD-10-CM

## 2022-09-22 DIAGNOSIS — Z0000 Encounter for general adult medical examination without abnormal findings: Secondary | ICD-10-CM

## 2022-09-22 LAB — POCT GLYCOSYLATED HEMOGLOBIN (HGB A1C): Hemoglobin A1C: 7.3 % — AB (ref 4.0–5.6)

## 2022-09-22 LAB — GLUCOSE, CAPILLARY: Glucose-Capillary: 221 mg/dL — ABNORMAL HIGH (ref 70–99)

## 2022-09-22 MED ORDER — ATORVASTATIN CALCIUM 10 MG PO TABS
10.0000 mg | ORAL_TABLET | Freq: Every day | ORAL | 11 refills | Status: DC
  Filled 2022-09-22: qty 30, 30d supply, fill #0
  Filled 2022-12-12: qty 30, 30d supply, fill #1
  Filled 2023-02-28: qty 30, 30d supply, fill #2
  Filled 2023-06-11: qty 30, 30d supply, fill #3

## 2022-09-22 MED ORDER — METFORMIN HCL 500 MG PO TABS
1000.0000 mg | ORAL_TABLET | Freq: Two times a day (BID) | ORAL | 11 refills | Status: DC
  Filled 2022-09-22 (×2): qty 120, 30d supply, fill #0
  Filled 2022-12-12: qty 120, 30d supply, fill #1
  Filled 2023-02-28: qty 120, 30d supply, fill #2
  Filled 2023-06-11: qty 120, 30d supply, fill #3

## 2022-09-22 MED ORDER — METFORMIN HCL 500 MG PO TABS
500.0000 mg | ORAL_TABLET | Freq: Two times a day (BID) | ORAL | 3 refills | Status: DC
  Filled 2022-09-22: qty 180, 90d supply, fill #0

## 2022-09-22 NOTE — Patient Instructions (Addendum)
Thank you, Stephen Wilson for allowing [place] to provide your care today.   Diabetes -A1c 7.3% today. Please make sure to take your metformin daily. You can start with 500 mg twice daily, if tolerating increase to 1000 mg twice daily. Please let me know if you have any side effects.  -Completed foot exam today -Referral to eye doctor today -Follow up visit in 3 months  Cholesterol -I have sent prescription for Lipitor 10 mg daily.  -We will provide orange card application today.   I have ordered the following labs for you:  Lab Orders         Glucose, capillary         POC Hbg A1C      Referrals ordered today:   Referral Orders         Ambulatory referral to Ophthalmology       I have ordered the following medication/changed the following medications:   Stop the following medications: Medications Discontinued During This Encounter  Medication Reason   DULoxetine (CYMBALTA) 30 MG capsule    naproxen sodium (ANAPROX) 220 MG tablet    metFORMIN (GLUCOPHAGE) 500 MG tablet Reorder   metFORMIN (GLUCOPHAGE) 500 MG tablet Reorder     Start the following medications: Meds ordered this encounter  Medications   DISCONTD: metFORMIN (GLUCOPHAGE) 500 MG tablet    Sig: Take 1 tablet (500 mg total) by mouth 2 (two) times daily with a meal.    Dispense:  180 tablet    Refill:  3    IM program   atorvastatin (LIPITOR) 10 MG tablet    Sig: Take 1 tablet (10 mg total) by mouth daily.    Dispense:  30 tablet    Refill:  11    IM program   metFORMIN (GLUCOPHAGE) 500 MG tablet    Sig: Take 2 tablets (1,000 mg total) by mouth 2 (two) times daily with a meal.    Dispense:  120 tablet    Refill:  11    IM program     Follow up: 3 months    Should you have any questions or concerns please call the internal medicine clinic at [phone].    Angelique Blonder, D.O. [place]

## 2022-09-22 NOTE — Assessment & Plan Note (Signed)
A1c today was 7.3 from 5.7 in 08/2021. He is on metformin 500 mg twice daily. He reports he ran out of metformin about 2-3 weeks ago. He was tolerating dose with no side effects. Patient reports neuropathy has improved and does not have any symptoms currently. Not taking gabapentin or duloxetine. Foot exam performed today. Given lack of insurance, will defer urine microalbumin creatinine today.   Plan -Refilled metformin today. Will restart at 500 mg twice daily for 1-2 weeks, then titrate up by 500 mg for 1-2 weeks, then up to max 1000 mg twice daily -Referral to ophthalmology

## 2022-09-22 NOTE — Assessment & Plan Note (Addendum)
Lipid Panel     Component Value Date/Time   CHOL  02/16/2010 1139    179        ATP III CLASSIFICATION:  <200     mg/dL   Desirable  200-239  mg/dL   Borderline High  >=240    mg/dL   High          TRIG 148 02/16/2010 1139   HDL 36 (L) 02/16/2010 1139   CHOLHDL 5.0 02/16/2010 1139   VLDL 30 02/16/2010 1139   LDLCALC (H) 02/16/2010 1139    113        Total Cholesterol/HDL:CHD Risk Coronary Heart Disease Risk Table                     Men   Women  1/2 Average Risk   3.4   3.3  Average Risk       5.0   4.4  2 X Average Risk   9.6   7.1  3 X Average Risk  23.4   11.0        Use the calculated Patient Ratio above and the CHD Risk Table to determine the patient's CHD Risk.        ATP III CLASSIFICATION (LDL):  <100     mg/dL   Optimal  100-129  mg/dL   Near or Above                    Optimal  130-159  mg/dL   Borderline  160-189  mg/dL   High  >190     mg/dL   Very High   Last lipid panel found was from 2011 with LDL 113. He is not currently on any medications. Since he has diabetes, he would benefit from initiating statin therapy. Given lack of insurance, will hold off on repeating lipid panel. Provided him with [facility] card application today.   Plan -Start atorvastatin 10 mg daily -Consider repeat lipid panel once he receives orange card

## 2022-09-22 NOTE — Progress Notes (Signed)
CC: Routine checkup, medication refill  HPI:  Stephen Wilson is a 51 y.o. male living with a history stated below and presents today for routine checkup and medication refill. Please see problem based assessment and plan for additional details.  Past Medical History:  Diagnosis Date   Diabetes mellitus without complication (HCC)    Current Outpatient Medications  Medication Instructions   atorvastatin (LIPITOR) 10 mg, Oral, Daily   metFORMIN (GLUCOPHAGE) 1,000 mg, Oral, 2 times daily with meals    Review of Systems: ROS negative except for what is noted on the assessment and plan.  Vitals:   09/22/22 1013  BP: 109/79  Pulse: 75  Temp: 98.4 F (36.9 C)  TempSrc: Oral  SpO2: 100%  Weight: 168 lb (76.2 kg)  Height: 5\' 8"  (1.727 m)   Physical Exam: Constitutional: well-appearing male, sitting in chair, in no acute distress HENT: normocephalic atraumatic Neck: supple Cardiovascular: regular rate and rhythm, no m/r/g, 2+ dorsalis pedis pulse bilaterally Pulmonary/Chest: normal work of breathing on room air MSK: normal bulk and tone Neurological: alert & oriented x 3 Skin: warm and dry Psych: normal mood and behavior  Assessment & Plan:   Type 2 diabetes mellitus (HCC) A1c today was 7.3 from 5.7 in 08/2021. He is on metformin 500 mg twice daily. He reports he ran out of metformin about 2-3 weeks ago. He was tolerating dose with no side effects. Patient reports neuropathy has improved and does not have any symptoms currently. Not taking gabapentin or duloxetine. Foot exam performed today. Given lack of insurance, will defer urine microalbumin creatinine today.   Plan -Refilled metformin today. Will restart at 500 mg twice daily for 1-2 weeks, then titrate up by 500 mg for 1-2 weeks, then up to max 1000 mg twice daily -Referral to ophthalmology   Health care maintenance Patient declined flu shot today.  Patient does not have insurance currently, provided [facility]  card application for him to complete.  Hyperlipidemia Lipid Panel     Component Value Date/Time   CHOL  02/16/2010 1139    179        ATP III CLASSIFICATION:  <200     mg/dL   Desirable  200-239  mg/dL   Borderline High  >=240    mg/dL   High          TRIG 148 02/16/2010 1139   HDL 36 (L) 02/16/2010 1139   CHOLHDL 5.0 02/16/2010 1139   VLDL 30 02/16/2010 1139   LDLCALC (H) 02/16/2010 1139    113        Total Cholesterol/HDL:CHD Risk Coronary Heart Disease Risk Table                     Men   Women  1/2 Average Risk   3.4   3.3  Average Risk       5.0   4.4  2 X Average Risk   9.6   7.1  3 X Average Risk  23.4   11.0        Use the calculated Patient Ratio above and the CHD Risk Table to determine the patient's CHD Risk.        ATP III CLASSIFICATION (LDL):  <100     mg/dL   Optimal  100-129  mg/dL   Near or Above                    Optimal  130-159  mg/dL  Borderline  160-189  mg/dL   High  >709     mg/dL   Very High   Last lipid panel found was from 2011 with LDL 113. He is not currently on any medications. Since he has diabetes, he would benefit from initiating statin therapy. Given lack of insurance, will hold off on repeating lipid panel. Provided him with [facility] card application today.   Plan -Start atorvastatin 10 mg daily -Consider repeat lipid panel once he receives orange card   Patient seen with Dr. Rollene Fare, D.O. [facility] Internal Medicine, PGY-1 Phone: [phone] Date 09/22/2022 Time 10:37 PM

## 2022-09-22 NOTE — Assessment & Plan Note (Signed)
Patient declined flu shot today.  Patient does not have insurance currently, provided [facility] card application for him to complete.

## 2022-09-23 NOTE — Progress Notes (Signed)
Internal Medicine Clinic Attending  I saw and evaluated the patient.  I personally confirmed the key portions of the history and exam documented by Dr. Zheng and I reviewed pertinent patient test results.  The assessment, diagnosis, and plan were formulated together and I agree with the documentation in the resident's note.  

## 2022-12-12 ENCOUNTER — Other Ambulatory Visit (HOSPITAL_COMMUNITY): Payer: Self-pay

## 2022-12-13 ENCOUNTER — Other Ambulatory Visit (HOSPITAL_COMMUNITY): Payer: Self-pay

## 2023-02-28 ENCOUNTER — Other Ambulatory Visit (HOSPITAL_COMMUNITY): Payer: Self-pay

## 2023-06-11 ENCOUNTER — Other Ambulatory Visit: Payer: Self-pay

## 2023-11-28 ENCOUNTER — Other Ambulatory Visit: Payer: Self-pay | Admitting: Student

## 2023-11-28 ENCOUNTER — Other Ambulatory Visit (HOSPITAL_COMMUNITY): Payer: Self-pay

## 2023-11-28 DIAGNOSIS — E1142 Type 2 diabetes mellitus with diabetic polyneuropathy: Secondary | ICD-10-CM

## 2023-11-28 MED ORDER — METFORMIN HCL 500 MG PO TABS
1000.0000 mg | ORAL_TABLET | Freq: Two times a day (BID) | ORAL | 1 refills | Status: DC
  Filled 2023-11-28: qty 120, 30d supply, fill #0

## 2023-11-28 NOTE — Addendum Note (Signed)
 Addended by: Cala Bradford on: 11/28/2023 03:04 PM   Modules accepted: Orders

## 2023-11-28 NOTE — Telephone Encounter (Signed)
 Not [facility] patient

## 2023-11-28 NOTE — Telephone Encounter (Addendum)
 Confirmed patient wants to continue to receive care with [place]. Patient is scheduled to return 12/12/23. Medication has been refilled until patient is seen.

## 2023-12-11 ENCOUNTER — Other Ambulatory Visit (HOSPITAL_COMMUNITY): Payer: Self-pay

## 2023-12-12 ENCOUNTER — Ambulatory Visit: Payer: Self-pay | Admitting: Student

## 2023-12-12 ENCOUNTER — Other Ambulatory Visit (HOSPITAL_COMMUNITY): Payer: Self-pay

## 2023-12-12 VITALS — BP 115/72 | HR 80 | Temp 97.7°F | Ht 68.0 in | Wt 168.4 lb

## 2023-12-12 DIAGNOSIS — Z0000 Encounter for general adult medical examination without abnormal findings: Secondary | ICD-10-CM

## 2023-12-12 DIAGNOSIS — Z7984 Long term (current) use of oral hypoglycemic drugs: Secondary | ICD-10-CM

## 2023-12-12 DIAGNOSIS — E785 Hyperlipidemia, unspecified: Secondary | ICD-10-CM

## 2023-12-12 DIAGNOSIS — E119 Type 2 diabetes mellitus without complications: Secondary | ICD-10-CM

## 2023-12-12 DIAGNOSIS — E1142 Type 2 diabetes mellitus with diabetic polyneuropathy: Secondary | ICD-10-CM

## 2023-12-12 DIAGNOSIS — Z716 Tobacco abuse counseling: Secondary | ICD-10-CM

## 2023-12-12 DIAGNOSIS — F17210 Nicotine dependence, cigarettes, uncomplicated: Secondary | ICD-10-CM

## 2023-12-12 LAB — GLUCOSE, CAPILLARY: Glucose-Capillary: 317 mg/dL — ABNORMAL HIGH (ref 70–99)

## 2023-12-12 LAB — POCT GLYCOSYLATED HEMOGLOBIN (HGB A1C): Hemoglobin A1C: 8 % — AB (ref 4.0–5.6)

## 2023-12-12 MED ORDER — METFORMIN HCL 500 MG PO TABS
1000.0000 mg | ORAL_TABLET | Freq: Two times a day (BID) | ORAL | 1 refills | Status: AC
  Filled 2023-12-12: qty 120, 30d supply, fill #0

## 2023-12-12 MED ORDER — ATORVASTATIN CALCIUM 10 MG PO TABS
10.0000 mg | ORAL_TABLET | Freq: Every day | ORAL | 11 refills | Status: AC
  Filled 2023-12-12: qty 30, 30d supply, fill #0

## 2023-12-12 NOTE — Assessment & Plan Note (Signed)
Patient was prescribed atorvastatin 10 mg daily at last [facility] visit on 09/22/22. Tolerated well. Has not taken for about 2 months as he ran out of refills. Recent lipid panel not available as patient continues to be self pay.   Plan -Continue atorvastatin 10 mg daily, refill sent in today  -Lipid panel once he receives [facility] card

## 2023-12-12 NOTE — Assessment & Plan Note (Signed)
Patient states he had a colonoscopy done at [facility] about 7 years ago (at age 54). Will call to get results faxed. Per patient, return for repeat colonoscopy in 10 years.   Patient continues to be without insurance. Application for [employer] provided again today. Will follow up in 1 month to determine if patient was able to submit application successfully. Holding off on screening labs like lipid panel, HIV, Hep C for now.

## 2023-12-12 NOTE — Assessment & Plan Note (Signed)
Patient smoking about 3-4 cigarettes daily. States he is interested in decreasing amount he smokes and/or quitting, but no date in mind. Discussed options for medication support including nicotine replacement therapy/gum and Chantix. Patient provided with printed information on both. Will read over it and let us know at 1 month follow up visit if he would like to have anything prescribed to help with smoking cessation.

## 2023-12-12 NOTE — Patient Instructions (Signed)
Thank you, [name] for allowing [place] to provide your care today. Today we discussed your diabetes, medication refills, and smoking cessation.  I have ordered the following labs for you:   Lab Orders         Glucose, capillary         POC Hbg A1C      Tests ordered today:  None  Referrals ordered today:    Referral Orders         Ambulatory referral to Ophthalmology      I have ordered the following medication/changed the following medications:   Stop the following medications: Medications Discontinued During This Encounter  Medication Reason   atorvastatin (LIPITOR) 10 MG tablet Reorder   metFORMIN (GLUCOPHAGE) 500 MG tablet Reorder     Start the following medications: Meds ordered this encounter  Medications   atorvastatin (LIPITOR) 10 MG tablet    Sig: Take 1 tablet (10 mg total) by mouth daily.    Dispense:  30 tablet    Refill:  11    IM program   metFORMIN (GLUCOPHAGE) 500 MG tablet    Sig: Take 2 tablets (1,000 mg total) by mouth 2 (two) times daily with a meal.    Dispense:  120 tablet    Refill:  1    IM program     Follow up:  4 weeks (1 month)     Remember:   - Your Hgb A1c was 8% today, I have sent your refill for Metformin, please take consistently (1000 mg twice a day with meals) and we will recheck your A1c at your next visit.   - Please complete your application for financial assistance/insurance. We have some pending labs that we would like to order but will hold off on doing for now.   - We have referred you to the eye specialist, ophthalmologist. We will follow up on this at your next visit and if you are unable to go can discuss a retinal scan with an associated cost of about ~$104 dollars.   - A refill of your Atorvastatin/lipitor cholesterol medication has also been sent to your preferred pharmacy.   - We can further discuss smoking cessation at your next visit.   Should you have any questions or concerns please call the  internal medicine clinic at [phone].     Stephen Cerino Colbert Coyer, MD PGY-1 Internal Medicine Teaching Progam [facility]

## 2023-12-12 NOTE — Assessment & Plan Note (Signed)
Patient's Hgb A1c today 8.0% from 7.3% 09/2022. CBG 317, patient denies any symptoms. Had been taking metformin 1000 mg BID with meals since November 2023 visit up until about 2 months ago when he ran out of refills. Tolerated well. States he sometimes checks his glucose with aunt who has diabetes, states glucose levels mostly around 250s especially after running out of refills. Patient denies any dysuria, changes in vision, or neuropathic pain today. Discussed importance of ophthalmology exam, patient has not been able to see due to lack of insurance. Will place referral again today as patient completes his [facility] card application. Also mentioned option for retinal scan (can be cost prohibitive, ~$104). Diabetic foot exam performed today, bilateral pulses WNL.  Plan - Refilled metformin 1000 mg BID with meals  - Ophthalmology referral - Patient to consider need for own glucose monitor, can prescribe through IM program  - 1 month follow up visit, repeat A1c

## 2023-12-12 NOTE — Progress Notes (Signed)
Established Patient Office Visit  Subjective   Patient ID: Stephen Wilson, male    DOB: September 02, 1971  Age: 53 y.o. MRN: [id number]  Chief Complaint  Patient presents with   Medical Management of Chronic Issues   Patient is a 53 y.o. with a past medical history stated below who presents today for follow-up for T2DM, HLD, medication refills, and healthcare maintenance. Please see problem based assessment and plan for additional details.     Past Medical History:  Diagnosis Date   Diabetes mellitus without complication (HCC)    Review of Systems  Respiratory:  Negative for shortness of breath.   Cardiovascular:  Negative for chest pain and leg swelling.  Gastrointestinal:  Negative for abdominal pain, constipation and diarrhea.  Genitourinary:  Negative for dysuria.  Neurological:  Negative for dizziness and headaches.     Objective:     BP 115/72 (BP Location: Left Arm, Patient Position: Sitting, Cuff Size: Normal)   Pulse 80   Temp 97.7 F (36.5 C) (Oral)   Ht 5\' 8"  (1.727 m)   Wt 168 lb 6.4 oz (76.4 kg)   SpO2 99%   BMI 25.61 kg/m  BP Readings from Last 3 Encounters:  12/12/23 115/72  09/22/22 109/79  09/16/21 105/80   Wt Readings from Last 3 Encounters:  12/12/23 168 lb 6.4 oz (76.4 kg)  09/22/22 168 lb (76.2 kg)  09/16/21 147 lb 6.4 oz (66.9 kg)   Physical Exam Constitutional:      Appearance: Normal appearance.  HENT:     Head: Normocephalic and atraumatic.     Mouth/Throat:     Mouth: Mucous membranes are moist.  Eyes:     Extraocular Movements: Extraocular movements intact.     Pupils: Pupils are equal, round, and reactive to light.  Cardiovascular:     Rate and Rhythm: Normal rate and regular rhythm.     Pulses: Normal pulses.  Pulmonary:     Effort: Pulmonary effort is normal.     Breath sounds: Normal breath sounds.  Abdominal:     General: Abdomen is flat. Bowel sounds are normal.     Palpations: Abdomen is soft.  Musculoskeletal:         General: Normal range of motion.  Skin:    General: Skin is warm and dry.  Neurological:     General: No focal deficit present.     Mental Status: He is alert.  Psychiatric:        Mood and Affect: Mood normal.        Behavior: Behavior normal.     Results for orders placed or performed in visit on 12/12/23  Glucose, capillary  Result Value Ref Range   Glucose-Capillary 317 (H) 70 - 99 mg/dL  POC Hbg W0J  Result Value Ref Range   Hemoglobin A1C 8.0 (A) 4.0 - 5.6 %   HbA1c POC (<> result, manual entry)     HbA1c, POC (prediabetic range)     HbA1c, POC (controlled diabetic range)      Last hemoglobin A1c Lab Results  Component Value Date   HGBA1C 8.0 (A) 12/12/2023   The ASCVD Risk score (Arnett DK, et al., 2019) failed to calculate for the following reasons:   Cannot find a previous HDL lab   Cannot find a previous total cholesterol lab    Assessment & Plan:   Problem List Items Addressed This Visit     Type 2 diabetes mellitus (HCC) - Primary (Chronic)   Patient's  Hgb A1c today 8.0% from 7.3% 09/2022. CBG 317, patient denies any symptoms. Had been taking metformin 1000 mg BID with meals since November 2023 visit up until about 2 months ago when he ran out of refills. Tolerated well. States he sometimes checks his glucose with aunt who has diabetes, states glucose levels mostly around 250s especially after running out of refills. Patient denies any dysuria, changes in vision, or neuropathic pain today. Discussed importance of ophthalmology exam, patient has not been able to see due to lack of insurance. Will place referral again today as patient completes his [facility] card application. Also mentioned option for retinal scan (can be cost prohibitive, ~$104). Diabetic foot exam performed today, bilateral pulses WNL.  Plan - Refilled metformin 1000 mg BID with meals  - Ophthalmology referral - Patient to consider need for own glucose monitor, can prescribe through IM program   - 1 month follow up visit, repeat A1c      Relevant Medications   atorvastatin (LIPITOR) 10 MG tablet   metFORMIN (GLUCOPHAGE) 500 MG tablet   Other Relevant Orders   POC Hbg A1C (Completed)   Ambulatory referral to Ophthalmology   Health care maintenance   Patient states he had a colonoscopy done at [facility] about 7 years ago (at age 85). Will call to get results faxed. Per patient, return for repeat colonoscopy in 10 years.   Patient continues to be without insurance. Application for [employer] provided again today. Will follow up in 1 month to determine if patient was able to submit application successfully. Holding off on screening labs like lipid panel, HIV, Hep C for now.       Hyperlipidemia   Patient was prescribed atorvastatin 10 mg daily at last [facility] visit on 09/22/22. Tolerated well. Has not taken for about 2 months as he ran out of refills. Recent lipid panel not available as patient continues to be self pay.   Plan -Continue atorvastatin 10 mg daily, refill sent in today  -Lipid panel once he receives [facility] card      Relevant Medications   atorvastatin (LIPITOR) 10 MG tablet   Encounter for smoking cessation counseling   Patient smoking about 3-4 cigarettes daily. States he is interested in decreasing amount he smokes and/or quitting, but no date in mind. Discussed options for medication support including nicotine replacement therapy/gum and Chantix. Patient provided with printed information on both. Will read over it and let us know at 1 month follow up visit if he would like to have anything prescribed to help with smoking cessation.       Patient seen with Dr. Antony Contras.  Return in about 4 weeks (around 01/09/2024) for Diabetes management, smoking cessation follow up .   Ario Mcdiarmid Colbert Coyer, MD

## 2023-12-13 NOTE — Progress Notes (Signed)
 Internal Medicine Clinic Attending  Case discussed with the resident at the time of the visit.  We reviewed the resident's history and exam and pertinent patient test results.  I agree with the assessment, diagnosis, and plan of care documented in the resident's note.

## 2024-01-12 ENCOUNTER — Encounter: Payer: Self-pay | Admitting: Student

## 2024-01-25 ENCOUNTER — Emergency Department (HOSPITAL_COMMUNITY): Payer: Self-pay

## 2024-01-25 ENCOUNTER — Other Ambulatory Visit: Payer: Self-pay

## 2024-01-25 ENCOUNTER — Emergency Department (HOSPITAL_COMMUNITY): Admission: EM | Admit: 2024-01-25 | Discharge: 2024-01-25 | Discharge status: Against Medical Advice | Payer: Self-pay

## 2024-01-25 DIAGNOSIS — J101 Influenza due to other identified influenza virus with other respiratory manifestations: Secondary | ICD-10-CM | POA: Insufficient documentation

## 2024-01-25 DIAGNOSIS — E1165 Type 2 diabetes mellitus with hyperglycemia: Secondary | ICD-10-CM | POA: Insufficient documentation

## 2024-01-25 DIAGNOSIS — Z5329 Procedure and treatment not carried out because of patient's decision for other reasons: Secondary | ICD-10-CM | POA: Insufficient documentation

## 2024-01-25 DIAGNOSIS — Z7984 Long term (current) use of oral hypoglycemic drugs: Secondary | ICD-10-CM | POA: Insufficient documentation

## 2024-01-25 LAB — CBG MONITORING, ED: Glucose-Capillary: 222 mg/dL — ABNORMAL HIGH (ref 70–99)

## 2024-01-25 LAB — RESP PANEL BY RT-PCR (RSV, FLU A&B, COVID)  RVPGX2
Influenza A by PCR: POSITIVE — AB
Influenza B by PCR: NEGATIVE
Resp Syncytial Virus by PCR: NEGATIVE
SARS Coronavirus 2 by RT PCR: NEGATIVE

## 2024-01-25 MED ORDER — ALBUTEROL SULFATE HFA 108 (90 BASE) MCG/ACT IN AERS
2.0000 | INHALATION_SPRAY | Freq: Once | RESPIRATORY_TRACT | Status: AC
  Administered 2024-01-25: 2 via RESPIRATORY_TRACT
  Filled 2024-01-25: qty 6.7

## 2024-01-25 MED ORDER — IPRATROPIUM-ALBUTEROL 0.5-2.5 (3) MG/3ML IN SOLN
3.0000 mL | Freq: Once | RESPIRATORY_TRACT | Status: DC
  Filled 2024-01-25: qty 3

## 2024-01-25 NOTE — ED Triage Notes (Signed)
 Pt has been having flu like symptoms for 5 days. NVD, cough, feeling feverish, gen body aches.

## 2024-01-25 NOTE — ED Notes (Signed)
 Pt no longer seen in ED. Staff reports they saw him walking out of the ED

## 2024-01-25 NOTE — ED Provider Notes (Signed)
 [place] EMERGENCY DEPARTMENT AT [facility] Provider Note   CSN: [id number] Arrival date & time: 01/25/24  1205     History  Chief Complaint  Patient presents with   flu like symptoms    Stephen Wilson is a 53 y.o. male.  53 year old male with past medical history of diabetes and hyperlipidemia presenting to the emergency department today with cough, headache, and myalgias.  The patient states he has been feeling unwell now for the past 5 days.  He denies any associated nausea or vomiting.  Reports decreased appetite with this.  He came to the ER today due to ongoing symptoms.  Patient has had subjective fevers at home but has not checked his temperature.  He denies any neck pain or stiffness.  He came to the ER today for further evaluation.        Home Medications Prior to Admission medications   Medication Sig Start Date End Date Taking? Authorizing Provider  atorvastatin (LIPITOR) 10 MG tablet Take 1 tablet (10 mg total) by mouth daily. 12/12/23   Philomena Doheny, MD  metFORMIN (GLUCOPHAGE) 500 MG tablet Take 2 tablets (1,000 mg total) by mouth 2 (two) times daily with a meal. 12/12/23   Philomena Doheny, MD      Allergies    Sulfa antibiotics    Review of Systems   Review of Systems  Constitutional:  Positive for appetite change and fever.  Respiratory:  Positive for cough.   Musculoskeletal:  Positive for myalgias.  All other systems reviewed and are negative.   Physical Exam Updated Vital Signs BP 114/89   Pulse 89   Temp 98.3 F (36.8 C)   Resp 18   Ht 5\' 8"  (1.727 m)   Wt 72.6 kg   SpO2 99%   BMI 24.33 kg/m  Physical Exam Vitals and nursing note reviewed.   Gen: NAD Eyes: PERRL, EOMI HEENT: no oropharyngeal swelling Neck: trachea midline Resp: Wheezes/rhonchi noted in the left lower lung field Card: RRR, no murmurs, rubs, or gallops Abd: nontender, nondistended Extremities: no calf tenderness, no  edema Vascular: 2+ radial pulses bilaterally, 2+ DP pulses bilaterally Skin: no rashes Psyc: acting appropriately   ED Results / Procedures / Treatments   Labs (all labs ordered are listed, but only abnormal results are displayed) Labs Reviewed  RESP PANEL BY RT-PCR (RSV, FLU A&B, COVID)  RVPGX2 - Abnormal; Notable for the following components:      Result Value   Influenza A by PCR POSITIVE (*)    All other components within normal limits  CBG MONITORING, ED - Abnormal; Notable for the following components:   Glucose-Capillary 222 (*)    All other components within normal limits    EKG None  Radiology DG Chest 1 View Result Date: 01/25/2024 CLINICAL DATA:  Cough. EXAM: CHEST  1 VIEW COMPARISON:  05/14/2009 FINDINGS: The cardiomediastinal contours are normal. The lungs are clear. Pulmonary vasculature is normal. No consolidation, pleural effusion, or pneumothorax. No acute osseous abnormalities are seen. IMPRESSION: No active disease. Electronically Signed   By: Narda Rutherford M.D.   On: 01/25/2024 17:32    Procedures Procedures    Medications Ordered in ED Medications  albuterol (VENTOLIN HFA) 108 (90 Base) MCG/ACT inhaler 2 puff (2 puffs Inhalation Given 01/25/24 1526)    ED Course/ Medical Decision Making/ A&P  Medical Decision Making 53 year old male with past medical history of diabetes and hyperlipidemia presenting to the emergency department today with cough and myalgias.  I will further evaluate the patient here with a chest x-ray and flu swab.  Also obtain a glucose on the patient.  Will hold off on fluids or labs as he denies any nausea or vomiting.  If his glucose is abnormal we will consider lab testing.  I will give patient albuterol here for his wheezing.  I will reevaluate for ultimate disposition.  The patient's foot test is positive.  Chest x-ray is unremarkable on my read.  The patient eloped prior to reassessment and official  read of his chest x-ray.  Amount and/or Complexity of Data Reviewed Radiology: ordered.  Risk Prescription drug management.           Final Clinical Impression(s) / ED Diagnoses Final diagnoses:  Influenza A    Rx / DC Orders ED Discharge Orders     None         Durwin Glaze, MD 01/25/24 2243

## 2024-01-25 NOTE — ED Notes (Signed)
 Patient transported to X-ray
